# Patient Record
Sex: Female | Born: 1985 | Race: White | Hispanic: Yes | Marital: Married | Smoking: Never smoker
Health system: Southern US, Community
[De-identification: ages and names within clinical notes are randomized; demographics above are authoritative.]

## PROBLEM LIST (undated history)

## (undated) DIAGNOSIS — D649 Anemia, unspecified: Secondary | ICD-10-CM

## (undated) DIAGNOSIS — K219 Gastro-esophageal reflux disease without esophagitis: Secondary | ICD-10-CM

---

## 2006-08-31 ENCOUNTER — Inpatient Hospital Stay (HOSPITAL_COMMUNITY): Admission: AD | Admit: 2006-08-31 | Discharge: 2006-09-01 | Payer: Self-pay | Admitting: Family Medicine

## 2006-09-03 ENCOUNTER — Inpatient Hospital Stay (HOSPITAL_COMMUNITY): Admission: AD | Admit: 2006-09-03 | Discharge: 2006-09-03 | Payer: Self-pay | Admitting: Family Medicine

## 2007-01-11 IMAGING — US US TRANSVAGINAL NON-OB
1 series · 14 of 25 positions shown · non-contrast
Comparison: none

CLINICAL DATA: Pain on left side of stomach.
 TRANSABDOMINAL AND TRANSVAGINAL PELVIC ULTRASOUND:
TECHNIQUE: Both transabdominal and transvaginal ultrasound examinations of the pelvis were performed including evaluation of the uterus, ovaries, adnexal regions, and pelvic cul-de-sac.

[Series 1: us transvaginal non-ob · 0.22mm/px · 14 of 45 slices shown]
[im 1/45]
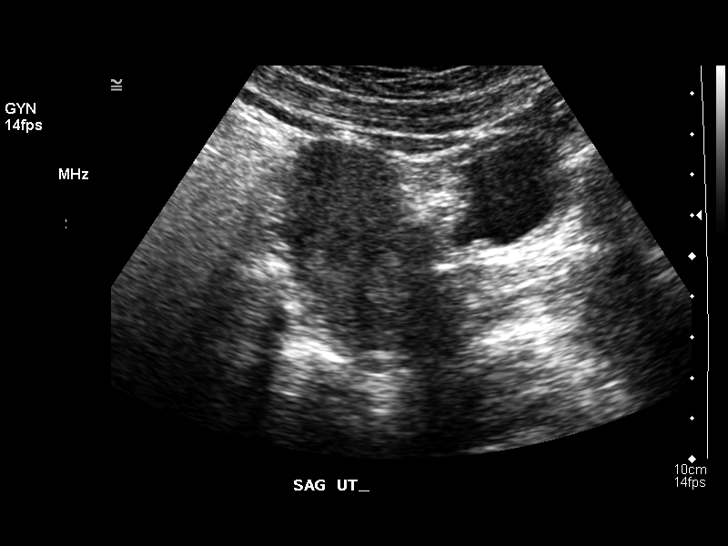
[im 4/45]
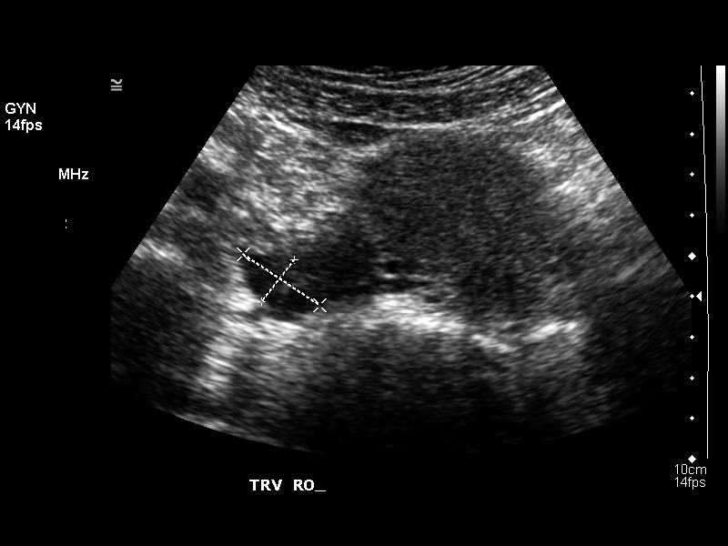
[im 8/45]
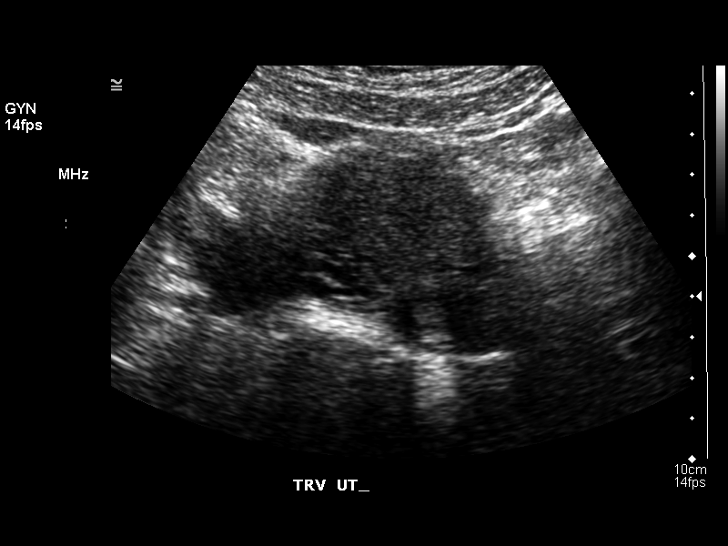
[im 12/45]
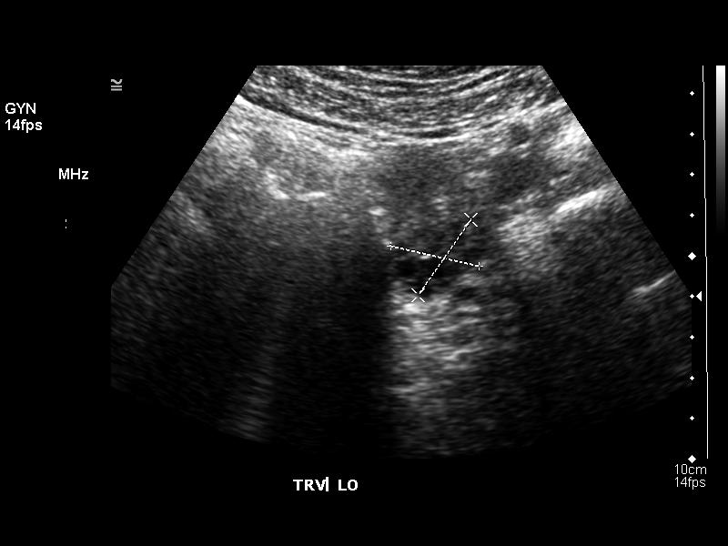
[im 15/45]
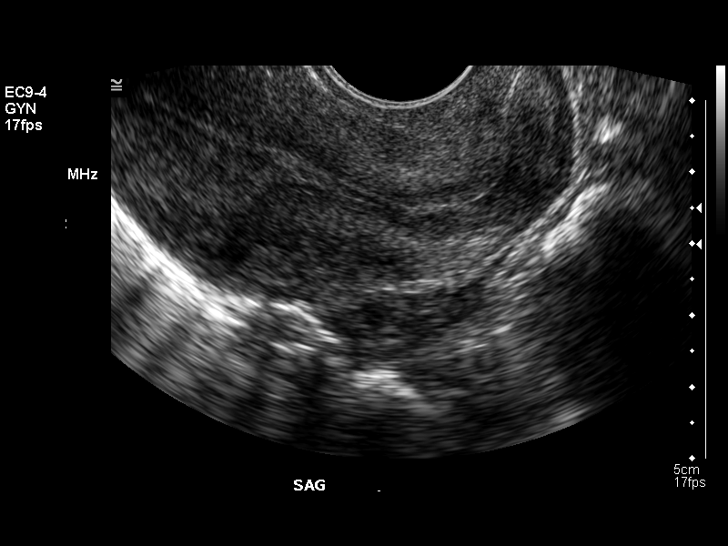
[im 17/45]
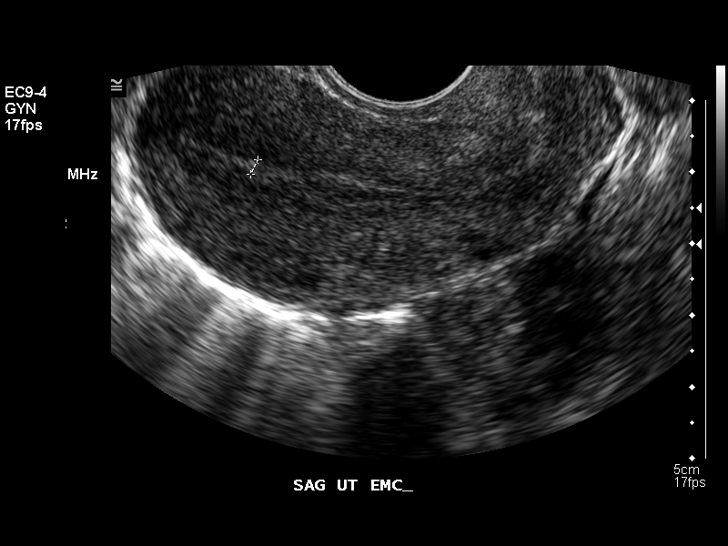
[im 21/45]
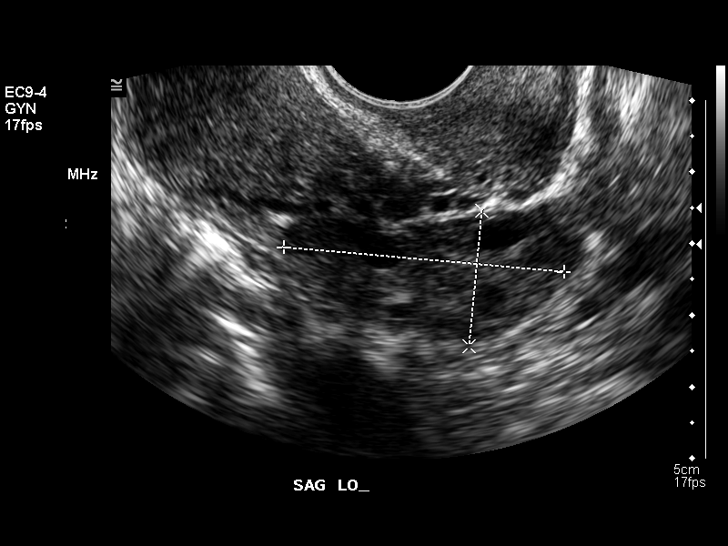
[im 24/45]
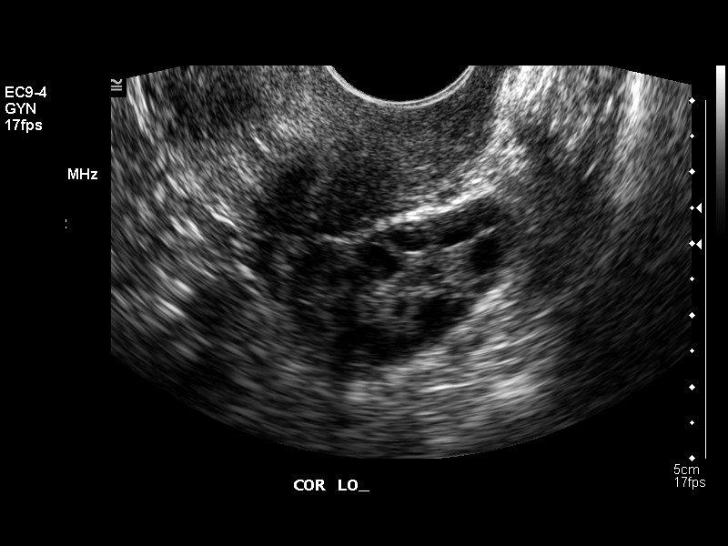
[im 28/45]
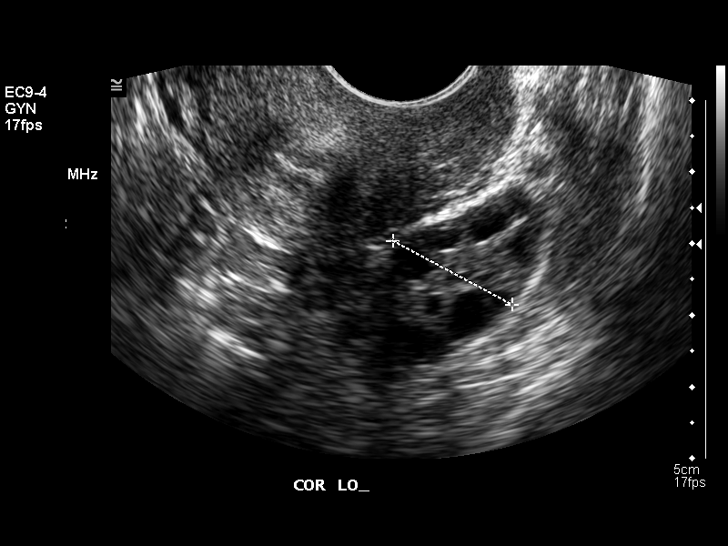
[im 30/45]
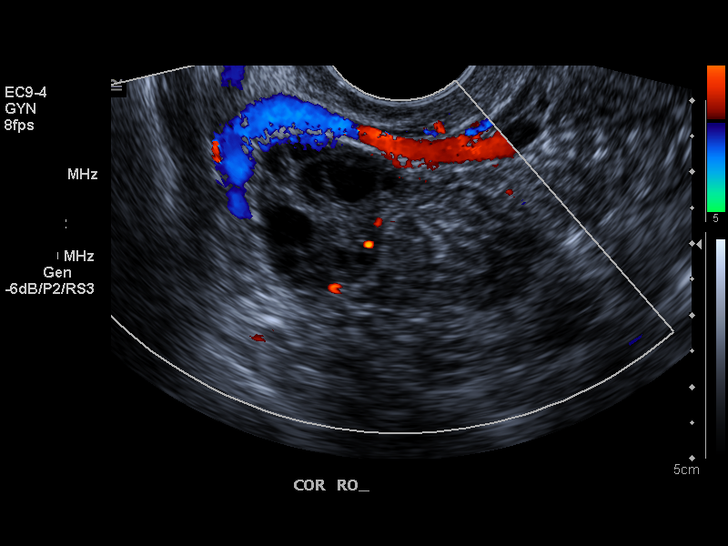
[im 34/45]
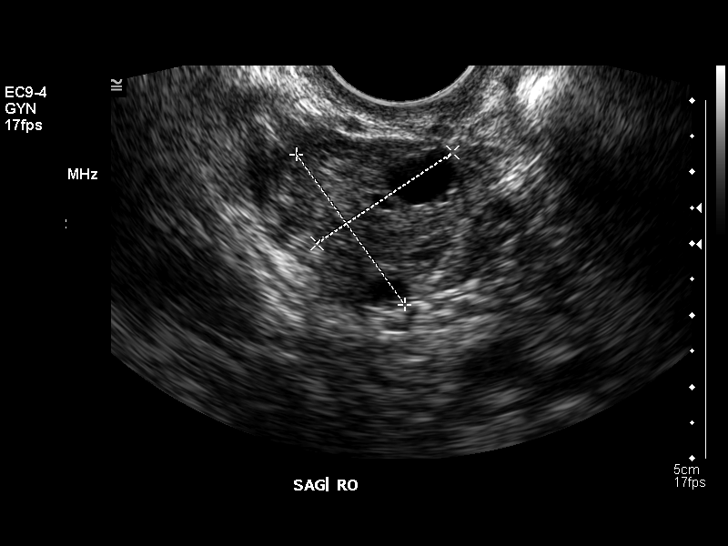
[im 37/45]
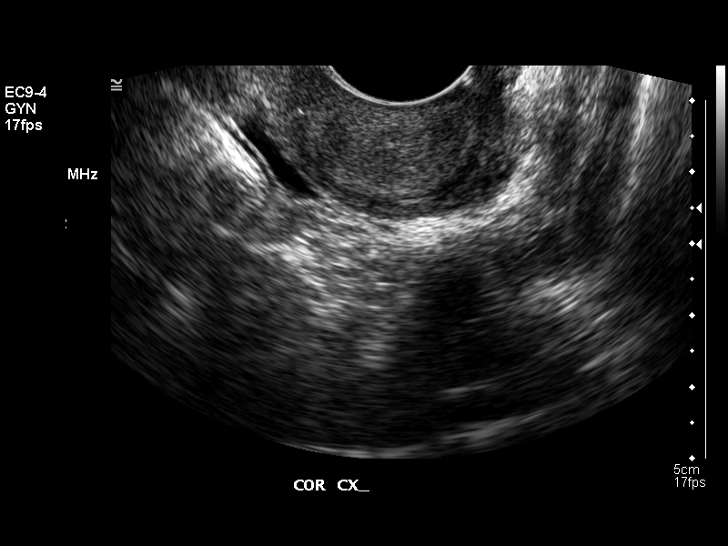
[im 41/45]
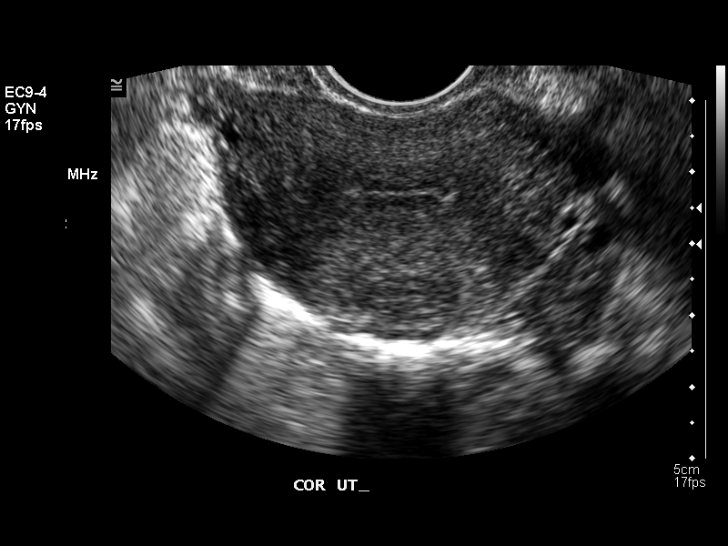
[im 45/45]
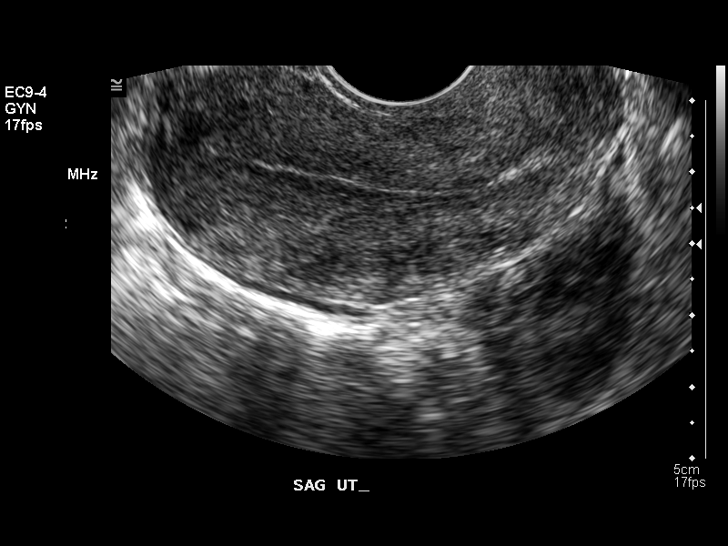

[14 of 25 positions shown; findings below may reference images not displayed]

FINDINGS: The uterus is normal in appearance.  Uterus measures 7.2 x 3.3 x 4.5 cm.  The endometrium measures 2.3 mm in thickness.  The right ovary is normal.  Left ovary is normal.  There is no free fluid.
IMPRESSION: Normal pelvic sonogram.

## 2007-12-06 ENCOUNTER — Ambulatory Visit (HOSPITAL_COMMUNITY): Admission: RE | Admit: 2007-12-06 | Discharge: 2007-12-06 | Payer: Self-pay | Admitting: Obstetrics

## 2008-01-03 ENCOUNTER — Inpatient Hospital Stay (HOSPITAL_COMMUNITY): Admission: AD | Admit: 2008-01-03 | Discharge: 2008-01-06 | Payer: Self-pay | Admitting: Obstetrics

## 2011-04-12 NOTE — Op Note (Signed)
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   POSTOPERATIVE DIAGNOSIS:  Cephalopelvic disproportion, failure to  progress in labor.   ANESTHESIA:  Spinal.   SURGEON:  Kathreen Cosier, M.D.   PROCEDURE:  The patient was placed on the operating table in a supine  position. After spinal was administered, the abdomen was prepped and  draped, and bladder emptied with a Foley catheter.  A transverse  suprapubic incision was made and carried down to the rectus fascia.  The  fascia cleaned and incised the length of incision.  The recti muscles  were retracted laterally.  The peritoneum is incised longitudinally.  A  transverse incision was made in the visceral peritoneum above the  bladder and the bladder mobilized inferiorly.  A transverse lower  uterine incision is made.  The patient delivered from the OP position of  a female Apgar 9/9, weighing 8 pounds 2 ounces. The fluid was clear and  the team was in attendance.  The placenta was posterior, removed  manually, and sent to labor and delivery.  The uterine cavity was  cleaned with dry laps.  The uterine incision was closed in one layer  with continuous suture of #1 chromic.  Hemostasis was satisfactory.  The  bladder flap was reattached with 2-0 chromic.  The uterus well  contracted.  Tubes and ovaries normal.  Abdomen closed in layers,  peritoneum continuous suture of 0 chromic, fascia continuous suture of 0  Dexon, and the skin closed with subcuticular stitch of 4-0 Monocryl.           ______________________________  Kathreen Cosier,  M.D.     BAM/MEDQ  D:  01/04/2008  T:  01/04/2008  Job:  [id number]

## 2011-04-12 NOTE — H&P (Signed)
[name], Lane     ACCOUNT NO.:  [id number]   MEDICAL RECORD NO.:  [id number]          PATIENT TYPE:  INP   LOCATION:  9132                          FACILITY:  [facility]   PHYSICIAN:  Kathreen Cosier, M.D.DATE OF BIRTH:  [birth year]   DATE OF ADMISSION:  01/03/2008  DATE OF DISCHARGE:                              HISTORY & PHYSICAL   PREOPERATIVE HISTORY AND PHYSICAL   The patient is a 25 year old gravida 2, para 0-0-1-0, [facility] December 29, 2007.  She was admitted post dates for induction.  Her cervix was 1 cm,  70% in the vertex at a -3 station, negative GBS.  Membranes were  ruptured artificially at 7:30.  The fluid was clear, and she was started  on Pitocin at 9:00 a.m.  She progressed slowly throughout the course of  the day, and by 8:00 p.m. on February 5, she was fully dilated.  Her  temperature was also 101, and she was given Ancef 1 gram IV and two  Tylenol.  She rapidly defervesced.  The patient pushed for 3 hours, with  a vertex at a zero station, the caput at +2 station, and was felt to be  OP.  No further progress was made after 3 hours of pushing, and it was  decided she would be delivered by a C-section for CPD.   PHYSICAL EXAM:  GENERAL:  Reveals a well-developed female in labor.  HEENT:  Negative.  LUNGS:  Clear.  HEART:  Regular rhythm.  No murmurs or gallops.  BREASTS:  No masses.  ABDOMEN:  Term size uterus.  Estimated fetal weight was 7 pounds.  EXTREMITIES:  Negative.           ______________________________  Kathreen Cosier, M.D.     BAM/MEDQ  D:  01/04/2008  T:  01/04/2008  Job:  [id number]

## 2011-04-15 NOTE — Discharge Summary (Signed)
Cindy Lane, Cindy Lane     ACCOUNT NO.:  [id number]   MEDICAL RECORD NO.:  [id number]          PATIENT TYPE:  INP   LOCATION:  9132                          [facility]ACILITY:  [facility]   PHYSICIAN:  Kathreen Cosier, M.D.DATE OF BIRTH:  [birth year]   DATE OF ADMISSION:  01/03/2008  DATE OF DISCHARGE:  01/06/2008                               DISCHARGE SUMMARY   The patient is a 25 year old gravida 2, para 0-0-1-0, [facility] December 29, 2007.  She was in for induction of 41 weeks.  Cervix 1 cm, 70%, vertex -  3.  The patient became fully dilated at 8:00 p.m. on February 5, pushed  for 3 hours to a 0 station, caput to +2 station, was known to be OP and  was delivered by C-section.  She had an 8-pound 2-ounce female, Apgar 9/9  from the OP position.  Postoperatively, she did well.  On admission, her  hemoglobin was 12; postoperative 9.3.  RPR negative, HIV negative.  She  was discharged on the third postoperative day ambulatory on a regular  diet to see me in 6 weeks.   DISCHARGE DIAGNOSIS:  Status post primary low transverse cesarean  section at term for cephalopelvic disproportion.           ______________________________  Kathreen Cosier, M.D.     BAM/MEDQ  D:  01/16/2008  T:  01/16/2008  Job:  [id number]

## 2011-08-19 LAB — RPR: RPR Ser Ql: NONREACTIVE

## 2011-08-19 LAB — CBC
HCT: 27.1 — ABNORMAL LOW
Hemoglobin: 12
Hemoglobin: 9.3 — ABNORMAL LOW
MCHC: 34.5
MCHC: 35
MCV: 83.8
Platelets: 225
RDW: 14
RDW: 14.4

## 2011-08-19 LAB — RAPID HIV SCREEN (WH-MAU): Rapid HIV Screen: NONREACTIVE

## 2012-05-07 ENCOUNTER — Other Ambulatory Visit (HOSPITAL_COMMUNITY): Payer: Self-pay | Admitting: *Deleted

## 2012-05-07 DIAGNOSIS — Z331 Pregnant state, incidental: Secondary | ICD-10-CM

## 2012-05-07 DIAGNOSIS — Z3689 Encounter for other specified antenatal screening: Secondary | ICD-10-CM

## 2012-05-07 LAB — OB RESULTS CONSOLE ABO/RH

## 2012-05-07 LAB — OB RESULTS CONSOLE RUBELLA ANTIBODY, IGM: Rubella: IMMUNE

## 2012-05-07 LAB — OB RESULTS CONSOLE ANTIBODY SCREEN: Antibody Screen: NEGATIVE

## 2012-05-10 ENCOUNTER — Other Ambulatory Visit (HOSPITAL_COMMUNITY): Payer: Self-pay | Admitting: Physician Assistant

## 2012-05-10 ENCOUNTER — Ambulatory Visit (HOSPITAL_COMMUNITY)
Admission: RE | Admit: 2012-05-10 | Discharge: 2012-05-10 | Disposition: A | Discharge status: Home / Self Care | Payer: Medicaid Other | Source: Ambulatory Visit | Attending: Physician Assistant | Admitting: Physician Assistant

## 2012-05-10 ENCOUNTER — Encounter (HOSPITAL_COMMUNITY): Payer: Self-pay

## 2012-05-10 DIAGNOSIS — Z3689 Encounter for other specified antenatal screening: Secondary | ICD-10-CM

## 2012-05-10 DIAGNOSIS — Z331 Pregnant state, incidental: Secondary | ICD-10-CM

## 2012-05-10 DIAGNOSIS — O358XX0 Maternal care for other (suspected) fetal abnormality and damage, not applicable or unspecified: Secondary | ICD-10-CM | POA: Insufficient documentation

## 2012-05-10 DIAGNOSIS — Z1389 Encounter for screening for other disorder: Secondary | ICD-10-CM | POA: Insufficient documentation

## 2012-05-10 DIAGNOSIS — Z363 Encounter for antenatal screening for malformations: Secondary | ICD-10-CM | POA: Insufficient documentation

## 2012-08-24 ENCOUNTER — Other Ambulatory Visit (HOSPITAL_COMMUNITY): Payer: Self-pay | Admitting: Nurse Practitioner

## 2012-08-24 DIAGNOSIS — O321XX0 Maternal care for breech presentation, not applicable or unspecified: Secondary | ICD-10-CM

## 2012-08-27 ENCOUNTER — Ambulatory Visit (HOSPITAL_COMMUNITY): Admission: RE | Admit: 2012-08-27 | Payer: Medicaid Other | Source: Ambulatory Visit

## 2012-09-03 ENCOUNTER — Encounter (HOSPITAL_COMMUNITY)
Admission: RE | Admit: 2012-09-03 | Discharge: 2012-09-03 | Disposition: A | Discharge status: Home / Self Care | Payer: Medicaid Other | Source: Ambulatory Visit | Attending: Family Medicine | Admitting: Family Medicine

## 2012-09-03 ENCOUNTER — Encounter (HOSPITAL_COMMUNITY): Payer: Self-pay

## 2012-09-03 ENCOUNTER — Encounter (HOSPITAL_COMMUNITY): Payer: Self-pay | Admitting: Pharmacy Technician

## 2012-09-03 HISTORY — DX: Anemia, unspecified: D64.9

## 2012-09-03 HISTORY — DX: Gastro-esophageal reflux disease without esophagitis: K21.9

## 2012-09-03 LAB — CBC
MCV: 83.7 fL (ref 78.0–100.0)
Platelets: 346 10*3/uL (ref 150–400)
RBC: 4.59 MIL/uL (ref 3.87–5.11)
RDW: 15.7 % — ABNORMAL HIGH (ref 11.5–15.5)
WBC: 11.1 10*3/uL — ABNORMAL HIGH (ref 4.0–10.5)

## 2012-09-03 LAB — SURGICAL PCR SCREEN
MRSA, PCR: NEGATIVE
Staphylococcus aureus: NEGATIVE

## 2012-09-03 LAB — TYPE AND SCREEN
ABO/RH(D): A POS
Antibody Screen: NEGATIVE

## 2012-09-03 LAB — RPR: RPR Ser Ql: NONREACTIVE

## 2012-09-03 NOTE — Patient Instructions (Addendum)
   Your procedure is scheduled AO:[id number] October 11th  Enter through the Main Entrance of [facility] at: 7:45am Pick up the phone at the desk and dial [phone] and inform us of your arrival.  Please call this number if you have any problems the morning of surgery: [phone]  Remember: Do not eat or drink anything after midnight on Thursday   Do not wear jewelry, make-up, or FINGER nail polish No metal in your hair or on your body. Do not wear lotions, powders, perfumes. You may wear deodorant.  Please use your CHG wash as directed prior to surgery.  Do not shave anywhere for at least 12 hours prior to first CHG shower.  Do not bring valuables to the hospital. Contacts, dentures or bridgework may not be worn into surgery.  Leave suitcase in the car. After Surgery it may be brought to your room. For patients being admitted to the hospital, checkout time is 11:00am the day of discharge.

## 2012-09-07 ENCOUNTER — Encounter (HOSPITAL_COMMUNITY): Payer: Self-pay | Admitting: Anesthesiology

## 2012-09-07 ENCOUNTER — Inpatient Hospital Stay (HOSPITAL_COMMUNITY): Payer: Medicaid Other | Admitting: Anesthesiology

## 2012-09-07 ENCOUNTER — Encounter (HOSPITAL_COMMUNITY)
Admission: RE | Disposition: A | Discharge status: Home / Self Care | Payer: Self-pay | Source: Ambulatory Visit | Attending: Family Medicine

## 2012-09-07 ENCOUNTER — Inpatient Hospital Stay (HOSPITAL_COMMUNITY)
Admission: RE | Admit: 2012-09-07 | Discharge: 2012-09-10 | DRG: 766 | Disposition: A | Discharge status: Home / Self Care | Payer: Medicaid Other | Source: Ambulatory Visit | Attending: Family Medicine | Admitting: Family Medicine

## 2012-09-07 ENCOUNTER — Encounter (HOSPITAL_COMMUNITY): Payer: Self-pay | Admitting: *Deleted

## 2012-09-07 DIAGNOSIS — O9279 Other disorders of lactation: Secondary | ICD-10-CM | POA: Diagnosis not present

## 2012-09-07 DIAGNOSIS — O34219 Maternal care for unspecified type scar from previous cesarean delivery: Principal | ICD-10-CM | POA: Diagnosis present

## 2012-09-07 SURGERY — Surgical Case
Anesthesia: Spinal | Wound class: Clean Contaminated

## 2012-09-07 MED ORDER — DIPHENHYDRAMINE HCL 50 MG/ML IJ SOLN: 12.5000 mg | INTRAMUSCULAR | Status: DC | PRN

## 2012-09-07 MED ORDER — ONDANSETRON HCL 4 MG/2ML IJ SOLN: 4.0000 mg | Freq: Three times a day (TID) | INTRAMUSCULAR | Status: DC | PRN

## 2012-09-07 MED ORDER — DEXTROSE IN LACTATED RINGERS 5 % IV SOLN
INTRAVENOUS | Status: DC
  Administered 2012-09-07 – 2012-09-08 (×2): via INTRAVENOUS

## 2012-09-07 MED ORDER — KETOROLAC TROMETHAMINE 30 MG/ML IJ SOLN: 30.0000 mg | Freq: Four times a day (QID) | INTRAMUSCULAR | Status: AC | PRN

## 2012-09-07 MED ORDER — MORPHINE SULFATE 0.5 MG/ML IJ SOLN
INTRAMUSCULAR | Status: AC
  Filled 2012-09-07: qty 10

## 2012-09-07 MED ORDER — ONDANSETRON HCL 4 MG/2ML IJ SOLN: 4.0000 mg | INTRAMUSCULAR | Status: DC | PRN

## 2012-09-07 MED ORDER — LACTATED RINGERS IV SOLN
40.0000 [IU] | INTRAVENOUS | Status: DC | PRN
  Administered 2012-09-07: 40 [IU] via INTRAVENOUS

## 2012-09-07 MED ORDER — KETOROLAC TROMETHAMINE 60 MG/2ML IM SOLN
60.0000 mg | Freq: Once | INTRAMUSCULAR | Status: AC | PRN
  Filled 2012-09-07: qty 2

## 2012-09-07 MED ORDER — WITCH HAZEL-GLYCERIN EX PADS: 1.0000 "application " | MEDICATED_PAD | CUTANEOUS | Status: DC | PRN

## 2012-09-07 MED ORDER — SCOPOLAMINE 1 MG/3DAYS TD PT72
1.0000 | MEDICATED_PATCH | Freq: Once | TRANSDERMAL | Status: DC
  Administered 2012-09-07: 1.5 mg via TRANSDERMAL

## 2012-09-07 MED ORDER — LACTATED RINGERS IV SOLN
INTRAVENOUS | Status: DC | PRN
  Administered 2012-09-07: 10:00:00 via INTRAVENOUS

## 2012-09-07 MED ORDER — MORPHINE SULFATE (PF) 0.5 MG/ML IJ SOLN
INTRAMUSCULAR | Status: DC | PRN
  Administered 2012-09-07: .1 mg via INTRATHECAL

## 2012-09-07 MED ORDER — MENTHOL 3 MG MT LOZG: 1.0000 | LOZENGE | OROMUCOSAL | Status: DC | PRN

## 2012-09-07 MED ORDER — DIPHENHYDRAMINE HCL 25 MG PO CAPS: 25.0000 mg | ORAL_CAPSULE | ORAL | Status: DC | PRN

## 2012-09-07 MED ORDER — TETANUS-DIPHTH-ACELL PERTUSSIS 5-2.5-18.5 LF-MCG/0.5 IM SUSP
0.5000 mL | Freq: Once | INTRAMUSCULAR | Status: DC
  Filled 2012-09-07 (×2): qty 0.5

## 2012-09-07 MED ORDER — LACTATED RINGERS IV SOLN
INTRAVENOUS | Status: DC
  Administered 2012-09-07: 10:00:00 via INTRAVENOUS
  Administered 2012-09-07: 125 mL/h via INTRAVENOUS

## 2012-09-07 MED ORDER — IBUPROFEN 600 MG PO TABS
600.0000 mg | ORAL_TABLET | Freq: Four times a day (QID) | ORAL | Status: DC
  Administered 2012-09-07 – 2012-09-10 (×12): 600 mg via ORAL
  Filled 2012-09-07 (×12): qty 1

## 2012-09-07 MED ORDER — HYDROMORPHONE HCL PF 1 MG/ML IJ SOLN: 0.2500 mg | INTRAMUSCULAR | Status: DC | PRN

## 2012-09-07 MED ORDER — BUPIVACAINE HCL (PF) 0.25 % IJ SOLN
INTRAMUSCULAR | Status: DC | PRN
  Administered 2012-09-07: 30 mL

## 2012-09-07 MED ORDER — NALOXONE HCL 0.4 MG/ML IJ SOLN: 0.4000 mg | INTRAMUSCULAR | Status: DC | PRN

## 2012-09-07 MED ORDER — SCOPOLAMINE 1 MG/3DAYS TD PT72
MEDICATED_PATCH | TRANSDERMAL | Status: AC
  Administered 2012-09-07: 1.5 mg via TRANSDERMAL
  Filled 2012-09-07: qty 1

## 2012-09-07 MED ORDER — CEFAZOLIN SODIUM-DEXTROSE 2-3 GM-% IV SOLR
2.0000 g | Freq: Once | INTRAVENOUS | Status: AC
  Administered 2012-09-07: 2 g via INTRAVENOUS

## 2012-09-07 MED ORDER — PANTOPRAZOLE SODIUM 40 MG PO TBEC
DELAYED_RELEASE_TABLET | ORAL | Status: AC
  Administered 2012-09-07: 40 mg via ORAL
  Filled 2012-09-07: qty 1

## 2012-09-07 MED ORDER — OXYTOCIN 40 UNITS IN LACTATED RINGERS INFUSION - SIMPLE MED: 62.5000 mL/h | INTRAVENOUS | Status: AC

## 2012-09-07 MED ORDER — SIMETHICONE 80 MG PO CHEW: 80.0000 mg | CHEWABLE_TABLET | ORAL | Status: DC | PRN

## 2012-09-07 MED ORDER — PRENATAL MULTIVITAMIN CH
1.0000 | ORAL_TABLET | Freq: Every day | ORAL | Status: DC
  Administered 2012-09-07 – 2012-09-10 (×4): 1 via ORAL
  Filled 2012-09-07 (×4): qty 1

## 2012-09-07 MED ORDER — SODIUM CHLORIDE 0.9 % IJ SOLN: 3.0000 mL | INTRAMUSCULAR | Status: DC | PRN

## 2012-09-07 MED ORDER — SIMETHICONE 80 MG PO CHEW
80.0000 mg | CHEWABLE_TABLET | Freq: Three times a day (TID) | ORAL | Status: DC
  Administered 2012-09-07 – 2012-09-10 (×11): 80 mg via ORAL

## 2012-09-07 MED ORDER — FENTANYL CITRATE 0.05 MG/ML IJ SOLN
INTRAMUSCULAR | Status: AC
  Filled 2012-09-07: qty 2

## 2012-09-07 MED ORDER — LANOLIN HYDROUS EX OINT: 1.0000 "application " | TOPICAL_OINTMENT | CUTANEOUS | Status: DC | PRN

## 2012-09-07 MED ORDER — FENTANYL CITRATE 0.05 MG/ML IJ SOLN
INTRAMUSCULAR | Status: DC | PRN
  Administered 2012-09-07: 15 ug via INTRATHECAL

## 2012-09-07 MED ORDER — PROMETHAZINE HCL 25 MG/ML IJ SOLN: 6.2500 mg | INTRAMUSCULAR | Status: DC | PRN

## 2012-09-07 MED ORDER — ONDANSETRON HCL 4 MG PO TABS: 4.0000 mg | ORAL_TABLET | ORAL | Status: DC | PRN

## 2012-09-07 MED ORDER — MEASLES, MUMPS & RUBELLA VAC ~~LOC~~ INJ
0.5000 mL | INJECTION | Freq: Once | SUBCUTANEOUS | Status: DC
  Filled 2012-09-07: qty 0.5

## 2012-09-07 MED ORDER — MEPERIDINE HCL 25 MG/ML IJ SOLN: 6.2500 mg | INTRAMUSCULAR | Status: DC | PRN

## 2012-09-07 MED ORDER — 0.9 % SODIUM CHLORIDE (POUR BTL) OPTIME
TOPICAL | Status: DC | PRN
  Administered 2012-09-07: 1000 mL

## 2012-09-07 MED ORDER — KETOROLAC TROMETHAMINE 30 MG/ML IJ SOLN
INTRAMUSCULAR | Status: AC
  Administered 2012-09-07: 30 mg via INTRAVENOUS
  Filled 2012-09-07: qty 1

## 2012-09-07 MED ORDER — METOCLOPRAMIDE HCL 5 MG/ML IJ SOLN: 10.0000 mg | Freq: Three times a day (TID) | INTRAMUSCULAR | Status: DC | PRN

## 2012-09-07 MED ORDER — ONDANSETRON HCL 4 MG/2ML IJ SOLN
INTRAMUSCULAR | Status: AC
  Filled 2012-09-07: qty 2

## 2012-09-07 MED ORDER — CEFAZOLIN SODIUM-DEXTROSE 2-3 GM-% IV SOLR
INTRAVENOUS | Status: AC
  Filled 2012-09-07: qty 50

## 2012-09-07 MED ORDER — BUPIVACAINE HCL (PF) 0.25 % IJ SOLN
INTRAMUSCULAR | Status: AC
  Filled 2012-09-07: qty 30

## 2012-09-07 MED ORDER — SODIUM CHLORIDE 0.9 % IV SOLN
1.0000 ug/kg/h | INTRAVENOUS | Status: DC | PRN
  Filled 2012-09-07: qty 2.5

## 2012-09-07 MED ORDER — BUPIVACAINE IN DEXTROSE 0.75-8.25 % IT SOLN
INTRATHECAL | Status: DC | PRN
  Administered 2012-09-07: 9 mg via INTRATHECAL

## 2012-09-07 MED ORDER — PANTOPRAZOLE SODIUM 40 MG PO TBEC
40.0000 mg | DELAYED_RELEASE_TABLET | Freq: Once | ORAL | Status: AC
  Administered 2012-09-07: 40 mg via ORAL

## 2012-09-07 MED ORDER — DIPHENHYDRAMINE HCL 50 MG/ML IJ SOLN: 25.0000 mg | INTRAMUSCULAR | Status: DC | PRN

## 2012-09-07 MED ORDER — NALBUPHINE HCL 10 MG/ML IJ SOLN
5.0000 mg | INTRAMUSCULAR | Status: DC | PRN
  Filled 2012-09-07: qty 1

## 2012-09-07 MED ORDER — KETOROLAC TROMETHAMINE 30 MG/ML IJ SOLN
15.0000 mg | Freq: Once | INTRAMUSCULAR | Status: AC | PRN
  Administered 2012-09-07: 30 mg via INTRAVENOUS

## 2012-09-07 MED ORDER — EPHEDRINE 5 MG/ML INJ
INTRAVENOUS | Status: AC
  Filled 2012-09-07: qty 10

## 2012-09-07 MED ORDER — DIBUCAINE 1 % RE OINT: 1.0000 "application " | TOPICAL_OINTMENT | RECTAL | Status: DC | PRN

## 2012-09-07 MED ORDER — ONDANSETRON HCL 4 MG/2ML IJ SOLN
INTRAMUSCULAR | Status: DC | PRN
  Administered 2012-09-07: 4 mg via INTRAVENOUS

## 2012-09-07 MED ORDER — OXYCODONE-ACETAMINOPHEN 5-325 MG PO TABS
1.0000 | ORAL_TABLET | ORAL | Status: DC | PRN
  Administered 2012-09-08 – 2012-09-09 (×4): 1 via ORAL
  Filled 2012-09-07 (×4): qty 1

## 2012-09-07 MED ORDER — SENNOSIDES-DOCUSATE SODIUM 8.6-50 MG PO TABS
2.0000 | ORAL_TABLET | Freq: Every day | ORAL | Status: DC
  Administered 2012-09-07 – 2012-09-08 (×2): 2 via ORAL

## 2012-09-07 MED ORDER — DIPHENHYDRAMINE HCL 25 MG PO CAPS: 25.0000 mg | ORAL_CAPSULE | Freq: Four times a day (QID) | ORAL | Status: DC | PRN

## 2012-09-07 MED ORDER — EPHEDRINE SULFATE 50 MG/ML IJ SOLN
INTRAMUSCULAR | Status: DC | PRN
  Administered 2012-09-07 (×4): 10 mg via INTRAVENOUS

## 2012-09-07 MED ORDER — ZOLPIDEM TARTRATE 5 MG PO TABS: 5.0000 mg | ORAL_TABLET | Freq: Every evening | ORAL | Status: DC | PRN

## 2012-09-07 SURGICAL SUPPLY — 37 items
CLOTH BEACON ORANGE TIMEOUT ST (SAFETY) ×2 IMPLANT
DRAPE SURG 17X23 STRL (DRAPES) ×2 IMPLANT
DRESSING TELFA 8X3 (GAUZE/BANDAGES/DRESSINGS) ×2 IMPLANT
DRSG COVADERM 4X10 (GAUZE/BANDAGES/DRESSINGS) IMPLANT
DURAPREP 26ML APPLICATOR (WOUND CARE) ×2 IMPLANT
ELECT REM PT RETURN 9FT ADLT (ELECTROSURGICAL) ×2
ELECTRODE REM PT RTRN 9FT ADLT (ELECTROSURGICAL) ×1 IMPLANT
EXTRACTOR VACUUM M CUP 4 TUBE (SUCTIONS) IMPLANT
GAUZE SPONGE 4X4 12PLY STRL LF (GAUZE/BANDAGES/DRESSINGS) ×4 IMPLANT
GLOVE BIOGEL PI IND STRL 7.0 (GLOVE) ×1 IMPLANT
GLOVE BIOGEL PI IND STRL 7.5 (GLOVE) IMPLANT
GLOVE BIOGEL PI INDICATOR 7.0 (GLOVE) ×1
GLOVE BIOGEL PI INDICATOR 7.5 (GLOVE) ×2
GLOVE ECLIPSE 7.0 STRL STRAW (GLOVE) ×4 IMPLANT
GLOVE SURG SS PI 7.0 STRL IVOR (GLOVE) ×2 IMPLANT
GOWN PREVENTION PLUS LG XLONG (DISPOSABLE) ×4 IMPLANT
GOWN PREVENTION PLUS XLARGE (GOWN DISPOSABLE) ×2 IMPLANT
KIT ABG SYR 3ML LUER SLIP (SYRINGE) IMPLANT
NDL HYPO 25X5/8 SAFETYGLIDE (NEEDLE) IMPLANT
NEEDLE HYPO 22GX1.5 SAFETY (NEEDLE) ×2 IMPLANT
NEEDLE HYPO 25X5/8 SAFETYGLIDE (NEEDLE) IMPLANT
NS IRRIG 1000ML POUR BTL (IV SOLUTION) ×2 IMPLANT
PACK C SECTION WH (CUSTOM PROCEDURE TRAY) ×2 IMPLANT
PAD ABD 7.5X8 STRL (GAUZE/BANDAGES/DRESSINGS) ×1 IMPLANT
PAD OB MATERNITY 4.3X12.25 (PERSONAL CARE ITEMS) ×1 IMPLANT
RTRCTR C-SECT PINK 25CM LRG (MISCELLANEOUS) ×1 IMPLANT
SLEEVE SCD COMPRESS KNEE MED (MISCELLANEOUS) IMPLANT
SPONGE GAUZE 4X4 12PLY (GAUZE/BANDAGES/DRESSINGS) ×1 IMPLANT
STAPLER VISISTAT 35W (STAPLE) IMPLANT
SUT VIC AB 0 CTX 36 (SUTURE) ×6
SUT VIC AB 0 CTX36XBRD ANBCTRL (SUTURE) ×3 IMPLANT
SUT VIC AB 4-0 KS 27 (SUTURE) IMPLANT
SYR 30ML LL (SYRINGE) ×2 IMPLANT
TAPE CLOTH SURG 4X10 WHT LF (GAUZE/BANDAGES/DRESSINGS) ×1 IMPLANT
TOWEL OR 17X24 6PK STRL BLUE (TOWEL DISPOSABLE) ×4 IMPLANT
TRAY FOLEY CATH 14FR (SET/KITS/TRAYS/PACK) ×2 IMPLANT
WATER STERILE IRR 1000ML POUR (IV SOLUTION) ×2 IMPLANT

## 2012-09-07 NOTE — Anesthesia Preprocedure Evaluation (Signed)
Anesthesia Evaluation  Patient identified by MRN, date of birth, ID band Patient awake    Reviewed: Allergy & Precautions, H&P , NPO status , Patient's Chart, lab work & pertinent test results  Airway Mallampati: I TM Distance: >3 FB Neck ROM: full    Dental No notable dental hx.    Pulmonary neg pulmonary ROS,    Pulmonary exam normal       Cardiovascular negative cardio ROS      Neuro/Psych negative neurological ROS  negative psych ROS   GI/Hepatic Neg liver ROS, GERD-  ,  Endo/Other  negative endocrine ROS  Renal/GU negative Renal ROS  negative genitourinary   Musculoskeletal negative musculoskeletal ROS (+)   Abdominal Normal abdominal exam  (+)   Peds negative pediatric ROS (+)  Hematology negative hematology ROS (+)   Anesthesia Other Findings   Reproductive/Obstetrics (+) Pregnancy                           Anesthesia Physical Anesthesia Plan  ASA: II  Anesthesia Plan: Spinal   Post-op Pain Management:    Induction:   Airway Management Planned:   Additional Equipment:   Intra-op Plan:   Post-operative Plan:   Informed Consent: I have reviewed the patients History and Physical, chart, labs and discussed the procedure including the risks, benefits and alternatives for the proposed anesthesia with the patient or authorized representative who has indicated his/her understanding and acceptance.     Plan Discussed with: CRNA and Surgeon  Anesthesia Plan Comments:         Anesthesia Quick Evaluation  

## 2012-09-07 NOTE — H&P (Signed)
  Cindy Lane is an 26 y.o. G2P1 [redacted]w[redacted]d female.   Chief Complaint: Previous Cesarean Section  HPI: 26 y.o. G2P1 @ [redacted]w[redacted]d with previous C-section.  Pt. Desires elective repeat.   Past Medical History  Diagnosis Date  . Anemia   . GERD (gastroesophageal reflux disease)     pregnancy    Past Surgical History  Procedure Date  . Cesarean section     No family history on file. Social History:  reports that she has never smoked. She does not have any smokeless tobacco history on file. She reports that she does not drink alcohol or use illicit drugs.  Allergies: No Known Allergies  Medications Prior to Admission  Medication Sig Dispense Refill  . Prenatal Vit-Fe Fumarate-FA (PRENATAL MULTIVITAMIN) TABS Take 1 tablet by mouth daily.         A comprehensive review of systems was negative.  Blood pressure 114/65, pulse 78, temperature 98.6 F (37 C), temperature source Oral, resp. rate 16, last menstrual period 11/21/2011, SpO2 99.00%. BP 114/65  Pulse 78  Temp 98.6 F (37 C) (Oral)  Resp 16  SpO2 99%  LMP 11/21/2011 General appearance: alert, cooperative and appears stated age Head: Normocephalic, without obvious abnormality, atraumatic Eyes: negative findings: sclera non-icteric Neck: no adenopathy, supple, symmetrical, trachea midline and thyroid not enlarged, symmetric, no tenderness/mass/nodules Lungs: clear to auscultation bilaterally Heart: regular rate and rhythm, S1, S2 normal, no murmur, click, rub or gallop Abdomen: soft, non-tender; bowel sounds normal; no masses,  no organomegaly Extremities: extremities normal, atraumatic, no cyanosis or edema Pulses: 2+ and symmetric Skin: Skin color, texture, turgor normal. No rashes or lesions Neurologic: Grossly normal   Lab Results  Component Value Date   WBC 11.1* 09/03/2012   HGB 12.5 09/03/2012   HCT 38.4 09/03/2012   MCV 83.7 09/03/2012   PLT 346 09/03/2012   No results found for this basename: PREGTESTUR,  PREGSERUM, HCG, HCGQUANT     Assessment/Plan Previous C-section For Elective repeat. Risks include but are not limited to bleeding, infection, injury to surrounding structures, including bowel, bladder and ureters, blood clots, and death.  Likelihood of success is high.   PRATT,TANYA S 09/07/2012, 8:57 AM

## 2012-09-07 NOTE — Transfer of Care (Signed)
Immediate Anesthesia Transfer of Care Note  Patient: Cindy Lane  Procedure(s) Performed: Procedure(s) (LRB) with comments: CESAREAN SECTION (N/A) - repeat  Patient Location: PACU  Anesthesia Type: Spinal  Level of Consciousness: awake, alert  and oriented  Airway & Oxygen Therapy: Patient Spontanous Breathing  Post-op Assessment: Report given to PACU RN and Post -op Vital signs reviewed and stable  Post vital signs: Reviewed and stable  Complications: No apparent anesthesia complications

## 2012-09-07 NOTE — Anesthesia Postprocedure Evaluation (Signed)
Anesthesia Post Note  Patient: Cindy Lane  Procedure(s) Performed: Procedure(s) (LRB): CESAREAN SECTION (N/A)  Anesthesia type: Spinal  Patient location: PACU  Post pain: Pain level controlled  Post assessment: Post-op Vital signs reviewed  Last Vitals:  Filed Vitals:   09/07/12 1130  BP: 106/85  Pulse: 71  Temp:   Resp: 20    Post vital signs: Reviewed  Level of consciousness: awake  Complications: No apparent anesthesia complications

## 2012-09-07 NOTE — Anesthesia Procedure Notes (Signed)
Spinal  Patient location during procedure: OR Start time: 09/07/2012 9:50 AM Staffing Performed by: anesthesiologist  Preanesthetic Checklist Completed: patient identified, site marked, surgical consent, pre-op evaluation, timeout performed, IV checked, risks and benefits discussed and monitors and equipment checked Spinal Block Patient position: sitting Prep: site prepped and draped and DuraPrep Patient monitoring: heart rate, cardiac monitor, continuous pulse ox and blood pressure Approach: midline Location: L3-4 Injection technique: single-shot Needle Needle type: Sprotte  Needle gauge: 24 G Needle length: 9 cm Assessment Sensory level: T4 Additional Notes Clear free flow CSF on second attempt by SRNA.  No paresthesia.  Patient tolerated procedure well.  Present for entire procedure.  Jasmine December, MD

## 2012-09-07 NOTE — Anesthesia Postprocedure Evaluation (Signed)
Anesthesia Post Note  Patient: Cindy Lane  Procedure(s) Performed: Procedure(s) (LRB): CESAREAN SECTION (N/A)  Anesthesia type: SAB  Patient location: Mother/Baby  Post pain: Pain level controlled  Post assessment: Post-op Vital signs reviewed  Last Vitals:  Filed Vitals:   09/07/12 1515  BP: 106/61  Pulse: 69  Temp: 36.8 C  Resp: 20    Post vital signs: Reviewed  Level of consciousness: awake  Complications: No apparent anesthesia complications

## 2012-09-07 NOTE — Addendum Note (Signed)
Addendum  created 09/07/12 1556 by Jhonnie Garner, CRNA   Modules edited:Notes Section

## 2012-09-07 NOTE — Op Note (Signed)
Preoperative Diagnosis:  IUP @ [redacted]w[redacted]d, Previous C-section, desired elective repeat  Postoperative Diagnosis:  Same  Procedure: Repeat low transverse cesarean section  Surgeon: Tinnie Gens, M.D.  Assistant: None  Findings: Viable female infant, APGAR (1 MIN): 9  APGAR (5 MINS): 9, vertex presentation, LOA position, nml tubes and ovaries  Estimated blood loss: 1000 cc  Complications: None known  Specimens: Placenta to labor and delivery  Reason for procedure: Briefly, the patient is a 26 y.o. G2P1 [redacted]w[redacted]d who presents for elective repeat C-section.  Risks of repeat cesarean section reviewed.  Risks include but are not limited to bleeding, infection, injury to surrounding structures, hysterectomy, future abnormal placentation, blood clots, and death. Cindy Lane, interpreter used.  Procedure: Patient is a to the OR where spinal analgesia was administered. She was then placed in a supine position with left lateral tilt. She received 2 g of Ancef and SCDs were in place. A timeout was performed. She was prepped and draped in the usual sterile fashion. A Foley catheter was placed in the bladder. A knife was then used to make a Pfannenstiel incision. This incision was carried out to underlying fascia which was divided in the midline with the elecrocautery. The incision was extended laterally, sharply.  The superior edge of fascia was dissected off the underlying rectus with the electrocautery.  The rectus was divided in the midline. Peritoneal adhesions were taken down with the cautery.  The left rectus was divided to allow adequate room for delivery. The peritoneal cavity was entered bluntly.  Alexis retractor was placed inside the incision.  A knife was used to make a low transverse incision on the uterus. This incision was carried down to the amniotic cavity which was entered. Fetus was in LOA position and was brought up out of the incision without difficulty. Cord was clamped x 2 and cut. Infant taken  to waiting pediatrician.  Placenta was delivered from the uterus.  Uterus was cleaned with dry lap pads. Uterine incision closed with 0 Vicryl suture in a locked running fashion. Hemostasis was noted.  Ovaries and tubes were inspected and were normal. Alexis retractor was removed from the abdomen. Peritoneal closure was done with 0 Vicryl suture.  Fascia is closed with 0 Vicryl suture in a running fashion. Subcutaneous tissue infused with 30cc 0.25% Marcaine. Skin closed using 3-0 Vicryl on a Keith needle, followed by pressure dressing.  All instrument, needle and lap counts were correct x 2.  Patient was awake and taken to PACU stable.  Infant to Newborn Nursery, stable.   Cindy Lane SMD 09/07/2012 10:43 AM

## 2012-09-08 LAB — CBC
HCT: 31.2 % — ABNORMAL LOW (ref 36.0–46.0)
Hemoglobin: 10.5 g/dL — ABNORMAL LOW (ref 12.0–15.0)
MCHC: 33.7 g/dL (ref 30.0–36.0)
RBC: 3.71 MIL/uL — ABNORMAL LOW (ref 3.87–5.11)
WBC: 12.6 10*3/uL — ABNORMAL HIGH (ref 4.0–10.5)

## 2012-09-08 NOTE — Progress Notes (Signed)
Subjective: Postpartum Day #1: Cesarean Delivery Patient reports tolerating PO and no problems voiding; bottlefeeding Objective: Vital signs in last 24 hours: Temp:  [97.8 F (36.6 C)-98.6 F (37 C)] 98.1 F (36.7 C) (10/12 0556) Pulse Rate:  [60-82] 71  (10/12 0556) Resp:  [14-20] 18  (10/12 0556) BP: (86-148)/(47-85) 101/47 mmHg (10/12 0556) SpO2:  [95 %-100 %] 96 % (10/12 0556) Weight:  [58.968 kg (130 lb)] 58.968 kg (130 lb) (10/11 1351)  Physical Exam:  General: alert, cooperative and mild distress Lochia: appropriate Uterine Fundus: firm Incision: dsg intact, clean & dry DVT Evaluation: No evidence of DVT seen on physical exam.   Basename 09/08/12 0615  HGB 10.5*  HCT 31.2*    Assessment/Plan: Status post Cesarean section. Doing well postoperatively.  Continue current care.  Cam Hai 09/08/2012, 7:27 AM

## 2012-09-09 NOTE — Progress Notes (Signed)
Subjective: Postpartum Day 2: Cesarean Delivery Patient reports tolerating PO, + flatus and no problems voiding.    Objective: Vital signs in last 24 hours: Temp:  [97.7 F (36.5 C)-98.4 F (36.9 C)] 97.9 F (36.6 C) (10/13 0521) Pulse Rate:  [66-82] 75  (10/13 0521) Resp:  [18-20] 18  (10/13 0521) BP: (100-110)/(43-71) 110/71 mmHg (10/13 0521) SpO2:  [98 %] 98 % (10/12 1008)  Physical Exam:  General: alert, cooperative and no distress Lochia: appropriate Uterine Fundus: firm Incision: dressing intact, no drainage DVT Evaluation: No evidence of DVT seen on physical exam. Negative Homan's sign. No cords or calf tenderness. No significant calf/ankle edema.   Basename 09/08/12 0615  HGB 10.5*  HCT 31.2*    Assessment/Plan: Status post Cesarean section. Doing well postoperatively.  Continue current care.  Breastfeeding, lactation consult.  Undecided about contraception.  Marge Duncans 09/09/2012, 7:33 AM

## 2012-09-10 ENCOUNTER — Encounter (HOSPITAL_COMMUNITY): Payer: Self-pay | Admitting: Family Medicine

## 2012-09-10 MED ORDER — IBUPROFEN 600 MG PO TABS: 600.0000 mg | ORAL_TABLET | Freq: Four times a day (QID) | ORAL | Status: DC

## 2012-09-10 MED ORDER — OXYCODONE-ACETAMINOPHEN 5-325 MG PO TABS: 1.0000 | ORAL_TABLET | ORAL | Status: DC | PRN

## 2012-09-10 MED ORDER — LANOLIN HYDROUS EX OINT: 1.0000 "application " | TOPICAL_OINTMENT | CUTANEOUS | Status: DC | PRN

## 2012-09-10 NOTE — Discharge Summary (Signed)
Obstetric Discharge Summary Reason for Admission: cesarean section Prenatal Procedures: ultrasound Intrapartum Procedures: cesarean: low cervical, transverse Postpartum Procedures: none Complications-Operative and Postpartum: none Hemoglobin  Date Value Range Status  09/08/2012 10.5* 12.0 - 15.0 g/dL Final     HCT  Date Value Range Status  09/08/2012 31.2* 36.0 - 46.0 % Final    Physical Exam:  General: alert, cooperative, happy, husband translated Lochia: appropriate Uterine Fundus: firm Incision: healing well DVT Evaluation: No evidence of DVT seen on physical exam. Negative Homan's sign. No cords or calf tenderness.  Discharge Diagnoses: Term Pregnancy-delivered  Discharge Information: Date: 09/10/2012 Activity: pelvic rest Diet:routine for mother; bottle feeding for baby Medications: Ibuprofen and Percocet Condition: stable Instructions: AVS Birth control: Condoms Discharge to: home Follow-up Information    Schedule an appointment as soon as possible for a visit in 6 weeks to follow up. (Follow up with your OB for postpartum check-up)          Newborn Data: Live born female  Birth Weight: 7 lb 9.3 oz (3440 g) APGAR: 9, 9  Home with mother.  Cindy Lane 09/10/2012, 7:47 AM

## 2012-09-10 NOTE — Progress Notes (Signed)
Patient was engorged, I had her ice for 20 minutes and then I set her up with an electric pump.  She was able to soften the breast and pumped off 50 ml.  She was encouraged to feed frequently. Teaching was done extensively.  Donzetta Sprung, R.N.

## 2012-09-11 LAB — TYPE AND SCREEN
ABO/RH(D): A POS
Unit division: 0
Unit division: 0

## 2012-09-19 NOTE — Discharge Summary (Signed)
Attestation of Attending Supervision of Advanced Practitioner: Evaluation and management procedures were performed by the PA/NP/CNM/OB Fellow under my supervision/collaboration. Chart reviewed and agree with management and plan.  Tilda Burrow 09/19/2012 10:45 PM   Attestation of Attending Supervision of Advanced Practitioner: Evaluation and management procedures were performed by the PA/NP/CNM/OB Fellow under my supervision/collaboration. Chart reviewed and agree with management and plan.  Arelie Kuzel V 09/19/2012 10:45 PM

## 2014-09-29 ENCOUNTER — Encounter (HOSPITAL_COMMUNITY): Payer: Self-pay | Admitting: Family Medicine

## 2022-02-07 LAB — OB RESULTS CONSOLE RUBELLA ANTIBODY, IGM: Rubella: IMMUNE

## 2022-02-07 LAB — OB RESULTS CONSOLE HIV ANTIBODY (ROUTINE TESTING): HIV: NONREACTIVE

## 2022-02-07 LAB — OB RESULTS CONSOLE ANTIBODY SCREEN: Antibody Screen: NEGATIVE

## 2022-02-07 LAB — OB RESULTS CONSOLE HEPATITIS B SURFACE ANTIGEN: Hepatitis B Surface Ag: NEGATIVE

## 2022-02-07 LAB — HEPATITIS C ANTIBODY: HCV Ab: NEGATIVE

## 2022-02-07 LAB — OB RESULTS CONSOLE RPR: RPR: NONREACTIVE

## 2022-02-07 LAB — OB RESULTS CONSOLE GC/CHLAMYDIA
Chlamydia: NEGATIVE
Neisseria Gonorrhea: NEGATIVE

## 2022-02-07 LAB — OB RESULTS CONSOLE ABO/RH: RH Type: POSITIVE

## 2022-05-20 ENCOUNTER — Encounter (HOSPITAL_COMMUNITY): Payer: Self-pay | Admitting: *Deleted

## 2022-05-20 ENCOUNTER — Telehealth (HOSPITAL_COMMUNITY): Payer: Self-pay | Admitting: *Deleted

## 2022-05-20 NOTE — Telephone Encounter (Signed)
Preadmission screen  

## 2022-05-20 NOTE — Pre-Procedure Instructions (Signed)
Interpreter number [phone]

## 2022-05-24 ENCOUNTER — Telehealth (HOSPITAL_COMMUNITY): Payer: Self-pay | Admitting: *Deleted

## 2022-05-25 ENCOUNTER — Encounter (HOSPITAL_COMMUNITY): Payer: Self-pay

## 2022-05-25 NOTE — Pre-Procedure Instructions (Signed)
Interpreter number [phone]

## 2022-06-10 ENCOUNTER — Other Ambulatory Visit (HOSPITAL_COMMUNITY)
Admission: RE | Admit: 2022-06-10 | Discharge: 2022-06-10 | Disposition: A | Discharge status: Home / Self Care | Payer: Self-pay | Source: Ambulatory Visit | Attending: Family Medicine | Admitting: Family Medicine

## 2022-06-10 DIAGNOSIS — Z3A00 Weeks of gestation of pregnancy not specified: Secondary | ICD-10-CM | POA: Insufficient documentation

## 2022-06-10 DIAGNOSIS — Z01812 Encounter for preprocedural laboratory examination: Secondary | ICD-10-CM | POA: Insufficient documentation

## 2022-06-10 DIAGNOSIS — O34219 Maternal care for unspecified type scar from previous cesarean delivery: Secondary | ICD-10-CM | POA: Insufficient documentation

## 2022-06-10 LAB — TYPE AND SCREEN
ABO/RH(D): A POS
Antibody Screen: NEGATIVE

## 2022-06-10 LAB — CBC
HCT: 38.6 % (ref 36.0–46.0)
Hemoglobin: 12.8 g/dL (ref 12.0–15.0)
MCH: 28.1 pg (ref 26.0–34.0)
MCHC: 33.2 g/dL (ref 30.0–36.0)
MCV: 84.6 fL (ref 80.0–100.0)
Platelets: 309 10*3/uL (ref 150–400)
RBC: 4.56 MIL/uL (ref 3.87–5.11)
RDW: 16.1 % — ABNORMAL HIGH (ref 11.5–15.5)
WBC: 8.6 10*3/uL (ref 4.0–10.5)
nRBC: 0 % (ref 0.0–0.2)

## 2022-06-11 ENCOUNTER — Encounter (HOSPITAL_COMMUNITY): Payer: Self-pay | Admitting: Obstetrics and Gynecology

## 2022-06-11 ENCOUNTER — Other Ambulatory Visit: Payer: Self-pay

## 2022-06-11 ENCOUNTER — Inpatient Hospital Stay (HOSPITAL_COMMUNITY)
Admission: AD | Admit: 2022-06-11 | Discharge: 2022-06-11 | Disposition: A | Discharge status: Home / Self Care | Payer: Self-pay | Attending: Obstetrics and Gynecology | Admitting: Obstetrics and Gynecology

## 2022-06-11 DIAGNOSIS — O471 False labor at or after 37 completed weeks of gestation: Secondary | ICD-10-CM | POA: Insufficient documentation

## 2022-06-11 DIAGNOSIS — Z3A38 38 weeks gestation of pregnancy: Secondary | ICD-10-CM

## 2022-06-11 DIAGNOSIS — O479 False labor, unspecified: Secondary | ICD-10-CM

## 2022-06-11 DIAGNOSIS — O34219 Maternal care for unspecified type scar from previous cesarean delivery: Secondary | ICD-10-CM | POA: Insufficient documentation

## 2022-06-11 LAB — RPR: RPR Ser Ql: NONREACTIVE

## 2022-06-11 NOTE — MAU Note (Signed)
Cindy Lane is a 36 y.o. at [redacted]w[redacted]d here in MAU reporting: she's having ctxs that are 10-15 minutes apart since 0400 this morning.  Denies LOF or VB, but red discharge with wiping.  Endorses +FM. LMP: N/A Onset of complaint: today Pain score: 4/10 Vitals:   06/11/22 0855  BP: 116/68  Pulse: 66  Resp: 20  Temp: 98.3 F (36.8 C)  SpO2: 98%     FHT: 140 bpm Lab orders placed from triage:   None

## 2022-06-11 NOTE — MAU Provider Note (Signed)
MAU Labor Check   S: Ms. Cindy Lane is a 36 y.o. G3P1002 at [redacted]w[redacted]d  who presents to MAU today complaining contractions q 10-15 minutes since this am. She denies vaginal bleeding, but has had some pink tinged discharge. She denies LOF. She reports normal fetal movement.  Scheduled for repeat CS on Monday.   O: BP 116/68 (BP Location: Right Arm)   Pulse 66   Temp 98.3 F (36.8 C) (Oral)   Resp 20   Wt 74.1 kg   SpO2 98%   BMI 32.98 kg/m   Cervical exam:  Dilation: 1.5 Effacement (%): Thick Exam by:: E.Edwards,RN Unchanged x2 >1 hr apart   Fetal Monitoring: Baseline: 130 Variability: moderate Accelerations: 15x15 Decelerations: None Contractions: About 5 ctx total on toco since arrival, ~ 10-25 minutes apart Appears comfortable w/ and w/o ctx per RN.    A: SIUP at [redacted]w[redacted]d  False labor Reactive NST  Previous CS   P: Discharge home with return precautions.  Return on Monday for repeat C/S or sooner if contractions increase.   Allayne Stack, DO 06/11/2022 11:09 AM

## 2022-06-12 ENCOUNTER — Encounter (HOSPITAL_COMMUNITY): Payer: Self-pay | Admitting: Obstetrics & Gynecology

## 2022-06-12 ENCOUNTER — Inpatient Hospital Stay (HOSPITAL_COMMUNITY): Admission: RE | Admit: 2022-06-12 | Payer: Self-pay | Source: Home / Self Care | Admitting: Family Medicine

## 2022-06-12 ENCOUNTER — Encounter (HOSPITAL_COMMUNITY)
Admission: RE | Disposition: A | Discharge status: Home / Self Care | Payer: Self-pay | Source: Home / Self Care | Attending: Obstetrics & Gynecology

## 2022-06-12 ENCOUNTER — Inpatient Hospital Stay (HOSPITAL_COMMUNITY): Payer: Medicaid Other | Admitting: Anesthesiology

## 2022-06-12 ENCOUNTER — Inpatient Hospital Stay (HOSPITAL_COMMUNITY)
Admission: RE | Admit: 2022-06-12 | Discharge: 2022-06-14 | DRG: 788 | Disposition: A | Discharge status: Home / Self Care | Payer: Medicaid Other | Attending: Obstetrics & Gynecology | Admitting: Obstetrics & Gynecology

## 2022-06-12 ENCOUNTER — Other Ambulatory Visit: Payer: Self-pay

## 2022-06-12 DIAGNOSIS — Z3A39 39 weeks gestation of pregnancy: Secondary | ICD-10-CM

## 2022-06-12 DIAGNOSIS — D509 Iron deficiency anemia, unspecified: Secondary | ICD-10-CM | POA: Diagnosis present

## 2022-06-12 DIAGNOSIS — O9902 Anemia complicating childbirth: Secondary | ICD-10-CM | POA: Diagnosis present

## 2022-06-12 DIAGNOSIS — D649 Anemia, unspecified: Secondary | ICD-10-CM

## 2022-06-12 DIAGNOSIS — R030 Elevated blood-pressure reading, without diagnosis of hypertension: Secondary | ICD-10-CM | POA: Diagnosis present

## 2022-06-12 DIAGNOSIS — O900 Disruption of cesarean delivery wound: Secondary | ICD-10-CM | POA: Diagnosis present

## 2022-06-12 DIAGNOSIS — O26893 Other specified pregnancy related conditions, third trimester: Secondary | ICD-10-CM | POA: Diagnosis present

## 2022-06-12 DIAGNOSIS — O99892 Other specified diseases and conditions complicating childbirth: Secondary | ICD-10-CM | POA: Diagnosis present

## 2022-06-12 DIAGNOSIS — O34211 Maternal care for low transverse scar from previous cesarean delivery: Secondary | ICD-10-CM

## 2022-06-12 DIAGNOSIS — O09513 Supervision of elderly primigravida, third trimester: Secondary | ICD-10-CM

## 2022-06-12 DIAGNOSIS — Z98891 History of uterine scar from previous surgery: Secondary | ICD-10-CM

## 2022-06-12 LAB — COMPREHENSIVE METABOLIC PANEL
ALT: 18 U/L (ref 0–44)
AST: 20 U/L (ref 15–41)
Albumin: 2.5 g/dL — ABNORMAL LOW (ref 3.5–5.0)
Alkaline Phosphatase: 185 U/L — ABNORMAL HIGH (ref 38–126)
Anion gap: 13 (ref 5–15)
BUN: 9 mg/dL (ref 6–20)
CO2: 19 mmol/L — ABNORMAL LOW (ref 22–32)
Calcium: 9.2 mg/dL (ref 8.9–10.3)
Chloride: 105 mmol/L (ref 98–111)
Creatinine, Ser: 0.65 mg/dL (ref 0.44–1.00)
GFR, Estimated: >60 mL/min (ref >60)
Glucose, Bld: 106 mg/dL — ABNORMAL HIGH (ref 70–99)
Potassium: 4.2 mmol/L (ref 3.5–5.1)
Sodium: 137 mmol/L (ref 135–145)
Total Bilirubin: 0.3 mg/dL (ref 0.3–1.2)
Total Protein: 6.9 g/dL (ref 6.5–8.1)

## 2022-06-12 LAB — CBC
HCT: 39.3 % (ref 36.0–46.0)
Hemoglobin: 13 g/dL (ref 12.0–15.0)
MCH: 28.3 pg (ref 26.0–34.0)
MCHC: 33.1 g/dL (ref 30.0–36.0)
MCV: 85.4 fL (ref 80.0–100.0)
Platelets: 295 10*3/uL (ref 150–400)
RBC: 4.6 MIL/uL (ref 3.87–5.11)
RDW: 15.9 % — ABNORMAL HIGH (ref 11.5–15.5)
WBC: 16.9 10*3/uL — ABNORMAL HIGH (ref 4.0–10.5)
nRBC: 0 % (ref 0.0–0.2)

## 2022-06-12 SURGERY — Surgical Case
Anesthesia: Spinal

## 2022-06-12 MED ORDER — FENTANYL CITRATE (PF) 100 MCG/2ML IJ SOLN
INTRAMUSCULAR | Status: DC | PRN
Start: 2022-06-12 — End: 2022-06-12
  Administered 2022-06-12: 15 ug via INTRATHECAL

## 2022-06-12 MED ORDER — EPHEDRINE SULFATE (PRESSORS) 50 MG/ML IJ SOLN
INTRAMUSCULAR | Status: DC | PRN
  Administered 2022-06-12 (×2): 5 mg via INTRAVENOUS

## 2022-06-12 MED ORDER — CEFAZOLIN SODIUM-DEXTROSE 2-4 GM/100ML-% IV SOLN
2.0000 g | INTRAVENOUS | Status: AC
  Administered 2022-06-12: 2 g via INTRAVENOUS
  Filled 2022-06-12: qty 100

## 2022-06-12 MED ORDER — SCOPOLAMINE 1 MG/3DAYS TD PT72
MEDICATED_PATCH | TRANSDERMAL | Status: AC
  Filled 2022-06-12: qty 1

## 2022-06-12 MED ORDER — HYDROMORPHONE HCL 1 MG/ML IJ SOLN: 0.2500 mg | INTRAMUSCULAR | Status: DC | PRN

## 2022-06-12 MED ORDER — OXYTOCIN-SODIUM CHLORIDE 30-0.9 UT/500ML-% IV SOLN
INTRAVENOUS | Status: DC | PRN
  Administered 2022-06-12: 250 mL/h via INTRAVENOUS

## 2022-06-12 MED ORDER — MORPHINE SULFATE (PF) 0.5 MG/ML IJ SOLN
INTRAMUSCULAR | Status: DC | PRN
  Administered 2022-06-12: 150 mg via INTRATHECAL

## 2022-06-12 MED ORDER — SCOPOLAMINE 1 MG/3DAYS TD PT72
1.0000 | MEDICATED_PATCH | Freq: Once | TRANSDERMAL | Status: DC
  Administered 2022-06-12: 1.5 mg via TRANSDERMAL

## 2022-06-12 MED ORDER — IBUPROFEN 600 MG PO TABS
600.0000 mg | ORAL_TABLET | Freq: Four times a day (QID) | ORAL | Status: DC
  Administered 2022-06-13 – 2022-06-14 (×4): 600 mg via ORAL
  Filled 2022-06-12 (×4): qty 1

## 2022-06-12 MED ORDER — DIBUCAINE (PERIANAL) 1 % EX OINT
1.0000 | TOPICAL_OINTMENT | CUTANEOUS | Status: DC | PRN
Start: 2022-06-12 — End: 2022-06-14

## 2022-06-12 MED ORDER — EPHEDRINE 5 MG/ML INJ
INTRAVENOUS | Status: AC
  Filled 2022-06-12: qty 5

## 2022-06-12 MED ORDER — DEXAMETHASONE SODIUM PHOSPHATE 10 MG/ML IJ SOLN
INTRAMUSCULAR | Status: AC
  Filled 2022-06-12: qty 1

## 2022-06-12 MED ORDER — ONDANSETRON HCL 4 MG/2ML IJ SOLN: 4.0000 mg | Freq: Once | INTRAMUSCULAR | Status: DC | PRN

## 2022-06-12 MED ORDER — SIMETHICONE 80 MG PO CHEW: 80.0000 mg | CHEWABLE_TABLET | ORAL | Status: DC | PRN

## 2022-06-12 MED ORDER — SENNOSIDES-DOCUSATE SODIUM 8.6-50 MG PO TABS
2.0000 | ORAL_TABLET | Freq: Every day | ORAL | Status: DC
  Administered 2022-06-13 – 2022-06-14 (×2): 2 via ORAL
  Filled 2022-06-12 (×2): qty 2

## 2022-06-12 MED ORDER — MEPERIDINE HCL 25 MG/ML IJ SOLN: 6.2500 mg | INTRAMUSCULAR | Status: DC | PRN

## 2022-06-12 MED ORDER — ATROPINE SULFATE 0.4 MG/ML IV SOLN
INTRAVENOUS | Status: AC
  Filled 2022-06-12: qty 1

## 2022-06-12 MED ORDER — OXYCODONE HCL 5 MG/5ML PO SOLN: 5.0000 mg | Freq: Once | ORAL | Status: DC | PRN

## 2022-06-12 MED ORDER — ENOXAPARIN SODIUM 40 MG/0.4ML IJ SOSY
40.0000 mg | PREFILLED_SYRINGE | INTRAMUSCULAR | Status: DC
  Administered 2022-06-13 – 2022-06-14 (×2): 40 mg via SUBCUTANEOUS
  Filled 2022-06-12 (×2): qty 0.4

## 2022-06-12 MED ORDER — OXYTOCIN-SODIUM CHLORIDE 30-0.9 UT/500ML-% IV SOLN: 2.5000 [IU]/h | INTRAVENOUS | Status: AC

## 2022-06-12 MED ORDER — ACETAMINOPHEN 500 MG PO TABS
1000.0000 mg | ORAL_TABLET | Freq: Four times a day (QID) | ORAL | Status: DC
Start: 2022-06-12 — End: 2022-06-14
  Administered 2022-06-12 – 2022-06-14 (×7): 1000 mg via ORAL
  Filled 2022-06-12 (×7): qty 2

## 2022-06-12 MED ORDER — OXYTOCIN-SODIUM CHLORIDE 30-0.9 UT/500ML-% IV SOLN
INTRAVENOUS | Status: AC
  Filled 2022-06-12: qty 500

## 2022-06-12 MED ORDER — DEXAMETHASONE SODIUM PHOSPHATE 10 MG/ML IJ SOLN
INTRAMUSCULAR | Status: DC | PRN
  Administered 2022-06-12: 10 mg via INTRAVENOUS

## 2022-06-12 MED ORDER — TETANUS-DIPHTH-ACELL PERTUSSIS 5-2.5-18.5 LF-MCG/0.5 IM SUSY: 0.5000 mL | PREFILLED_SYRINGE | Freq: Once | INTRAMUSCULAR | Status: DC

## 2022-06-12 MED ORDER — LACTATED RINGERS IV SOLN: INTRAVENOUS | Status: DC | PRN

## 2022-06-12 MED ORDER — TRANEXAMIC ACID-NACL 1000-0.7 MG/100ML-% IV SOLN
INTRAVENOUS | Status: AC
  Filled 2022-06-12: qty 100

## 2022-06-12 MED ORDER — CEFAZOLIN SODIUM-DEXTROSE 2-4 GM/100ML-% IV SOLN
INTRAVENOUS | Status: AC
  Filled 2022-06-12: qty 100

## 2022-06-12 MED ORDER — OXYCODONE HCL 5 MG PO TABS: 5.0000 mg | ORAL_TABLET | Freq: Once | ORAL | Status: DC | PRN

## 2022-06-12 MED ORDER — BUPIVACAINE IN DEXTROSE 0.75-8.25 % IT SOLN
INTRATHECAL | Status: DC | PRN
  Administered 2022-06-12: 1.5 mL via INTRATHECAL

## 2022-06-12 MED ORDER — COCONUT OIL OIL: 1.0000 | TOPICAL_OIL | Status: DC | PRN

## 2022-06-12 MED ORDER — ACETAMINOPHEN 10 MG/ML IV SOLN
INTRAVENOUS | Status: AC
  Filled 2022-06-12: qty 100

## 2022-06-12 MED ORDER — MEASLES, MUMPS & RUBELLA VAC IJ SOLR: 0.5000 mL | Freq: Once | INTRAMUSCULAR | Status: DC

## 2022-06-12 MED ORDER — SOD CITRATE-CITRIC ACID 500-334 MG/5ML PO SOLN
30.0000 mL | ORAL | Status: AC
  Administered 2022-06-12: 30 mL via ORAL
  Filled 2022-06-12: qty 30

## 2022-06-12 MED ORDER — MENTHOL 3 MG MT LOZG: 1.0000 | LOZENGE | OROMUCOSAL | Status: DC | PRN

## 2022-06-12 MED ORDER — DIPHENHYDRAMINE HCL 25 MG PO CAPS: 25.0000 mg | ORAL_CAPSULE | Freq: Four times a day (QID) | ORAL | Status: DC | PRN

## 2022-06-12 MED ORDER — ACETAMINOPHEN 10 MG/ML IV SOLN
INTRAVENOUS | Status: DC | PRN
  Administered 2022-06-12: 1000 mg via INTRAVENOUS

## 2022-06-12 MED ORDER — LACTATED RINGERS IV SOLN
INTRAVENOUS | Status: DC
  Administered 2022-06-12: 1000 mL via INTRAVENOUS

## 2022-06-12 MED ORDER — WITCH HAZEL-GLYCERIN EX PADS: 1.0000 | MEDICATED_PAD | CUTANEOUS | Status: DC | PRN

## 2022-06-12 MED ORDER — KETOROLAC TROMETHAMINE 30 MG/ML IJ SOLN: 30.0000 mg | Freq: Once | INTRAMUSCULAR | Status: DC | PRN

## 2022-06-12 MED ORDER — LACTATED RINGERS IV SOLN
INTRAVENOUS | Status: DC
Start: 2022-06-12 — End: 2022-06-14

## 2022-06-12 MED ORDER — ONDANSETRON HCL 4 MG/2ML IJ SOLN
INTRAMUSCULAR | Status: AC
  Filled 2022-06-12: qty 2

## 2022-06-12 MED ORDER — MEDROXYPROGESTERONE ACETATE 150 MG/ML IM SUSP: 150.0000 mg | INTRAMUSCULAR | Status: DC | PRN

## 2022-06-12 MED ORDER — FENTANYL CITRATE (PF) 100 MCG/2ML IJ SOLN
INTRAMUSCULAR | Status: AC
  Filled 2022-06-12: qty 2

## 2022-06-12 MED ORDER — TRANEXAMIC ACID-NACL 1000-0.7 MG/100ML-% IV SOLN
1000.0000 mg | INTRAVENOUS | Status: AC
  Administered 2022-06-12: 1000 mg via INTRAVENOUS
  Filled 2022-06-12: qty 100

## 2022-06-12 MED ORDER — KETOROLAC TROMETHAMINE 30 MG/ML IJ SOLN
30.0000 mg | Freq: Four times a day (QID) | INTRAMUSCULAR | Status: AC
  Administered 2022-06-12 – 2022-06-13 (×4): 30 mg via INTRAVENOUS
  Filled 2022-06-12 (×4): qty 1

## 2022-06-12 MED ORDER — PRENATAL MULTIVITAMIN CH
1.0000 | ORAL_TABLET | Freq: Every day | ORAL | Status: DC
  Administered 2022-06-13 – 2022-06-14 (×2): 1 via ORAL
  Filled 2022-06-12 (×2): qty 1

## 2022-06-12 MED ORDER — LACTATED RINGERS IV BOLUS
1000.0000 mL | Freq: Once | INTRAVENOUS | Status: AC
Start: 2022-06-12 — End: 2022-06-12
  Administered 2022-06-12: 1000 mL via INTRAVENOUS

## 2022-06-12 MED ORDER — STERILE WATER FOR IRRIGATION IR SOLN
Status: DC | PRN
  Administered 2022-06-12: 1

## 2022-06-12 MED ORDER — MORPHINE SULFATE (PF) 0.5 MG/ML IJ SOLN
INTRAMUSCULAR | Status: AC
  Filled 2022-06-12: qty 10

## 2022-06-12 MED ORDER — PHENYLEPHRINE HCL-NACL 20-0.9 MG/250ML-% IV SOLN
INTRAVENOUS | Status: DC | PRN
  Administered 2022-06-12: 60 ug/min via INTRAVENOUS

## 2022-06-12 MED ORDER — POVIDONE-IODINE 10 % EX SWAB
2.0000 | Freq: Once | CUTANEOUS | Status: AC
  Administered 2022-06-12: 2 via TOPICAL

## 2022-06-12 MED ORDER — OXYCODONE HCL 5 MG PO TABS: 5.0000 mg | ORAL_TABLET | ORAL | Status: DC | PRN

## 2022-06-12 MED ORDER — ONDANSETRON HCL 4 MG/2ML IJ SOLN
INTRAMUSCULAR | Status: DC | PRN
  Administered 2022-06-12: 4 mg via INTRAVENOUS

## 2022-06-12 MED ORDER — PHENYLEPHRINE HCL-NACL 20-0.9 MG/250ML-% IV SOLN
INTRAVENOUS | Status: AC
  Filled 2022-06-12: qty 250

## 2022-06-12 MED ORDER — SIMETHICONE 80 MG PO CHEW
80.0000 mg | CHEWABLE_TABLET | Freq: Three times a day (TID) | ORAL | Status: DC
Start: 2022-06-12 — End: 2022-06-14
  Administered 2022-06-12 – 2022-06-14 (×6): 80 mg via ORAL
  Filled 2022-06-12 (×6): qty 1

## 2022-06-12 SURGICAL SUPPLY — 35 items
ADH SKN CLS APL DERMABOND .7 (GAUZE/BANDAGES/DRESSINGS) ×1
CHLORAPREP W/TINT 26ML (MISCELLANEOUS) ×6 IMPLANT
CLAMP CORD UMBIL (MISCELLANEOUS) ×3 IMPLANT
CLOTH BEACON ORANGE TIMEOUT ST (SAFETY) ×3 IMPLANT
DERMABOND ADVANCED (GAUZE/BANDAGES/DRESSINGS) ×2
DERMABOND ADVANCED .7 DNX12 (GAUZE/BANDAGES/DRESSINGS) IMPLANT
DRSG OPSITE POSTOP 4X10 (GAUZE/BANDAGES/DRESSINGS) ×3 IMPLANT
ELECT REM PT RETURN 9FT ADLT (ELECTROSURGICAL)
ELECTRODE REM PT RTRN 9FT ADLT (ELECTROSURGICAL) ×2 IMPLANT
EXTRACTOR VACUUM BELL CUP MITY (SUCTIONS) ×1 IMPLANT
GLOVE BIOGEL PI IND STRL 7.0 (GLOVE) ×4 IMPLANT
GLOVE BIOGEL PI INDICATOR 7.0 (GLOVE) ×4
GLOVE ECLIPSE 7.0 STRL STRAW (GLOVE) ×3 IMPLANT
GOWN STRL REUS W/TWL LRG LVL3 (GOWN DISPOSABLE) ×6 IMPLANT
KIT ABG SYR 3ML LUER SLIP (SYRINGE) ×3 IMPLANT
NDL HYPO 25X5/8 SAFETYGLIDE (NEEDLE) ×2 IMPLANT
NEEDLE HYPO 25X5/8 SAFETYGLIDE (NEEDLE) ×2 IMPLANT
NS IRRIG 1000ML POUR BTL (IV SOLUTION) ×3 IMPLANT
PACK C SECTION WH (CUSTOM PROCEDURE TRAY) ×3 IMPLANT
PAD OB MATERNITY 4.3X12.25 (PERSONAL CARE ITEMS) ×3 IMPLANT
RTRCTR C-SECT PINK 25CM LRG (MISCELLANEOUS) ×3 IMPLANT
SUT MNCRL 0 VIOLET CTX 36 (SUTURE) ×4 IMPLANT
SUT MONOCRYL 0 CTX 36 (SUTURE) ×4
SUT PLAIN 0 NONE (SUTURE) IMPLANT
SUT PLAIN 2 0 (SUTURE)
SUT PLAIN 2 0 XLH (SUTURE) IMPLANT
SUT PLAIN ABS 2-0 CT1 27XMFL (SUTURE) IMPLANT
SUT VIC AB 0 CTX 36 (SUTURE) ×2
SUT VIC AB 0 CTX36XBRD ANBCTRL (SUTURE) ×2 IMPLANT
SUT VIC AB 2-0 CT1 27 (SUTURE)
SUT VIC AB 2-0 CT1 TAPERPNT 27 (SUTURE) IMPLANT
SUT VIC AB 4-0 KS 27 (SUTURE) ×3 IMPLANT
TOWEL OR 17X24 6PK STRL BLUE (TOWEL DISPOSABLE) ×3 IMPLANT
TRAY FOLEY W/BAG SLVR 14FR LF (SET/KITS/TRAYS/PACK) IMPLANT
WATER STERILE IRR 1000ML POUR (IV SOLUTION) ×3 IMPLANT

## 2022-06-12 NOTE — H&P (Signed)
OBSTETRIC ADMISSION HISTORY AND PHYSICAL  Cindy Lane is a 36 y.o. female G3P2002 with IUP at [redacted]w[redacted]d by [place] presenting for contractions. She reports +FMs and no LOF. She reports having some bloody show over the last day or so. She plans on breast and formula feeding. She is undecided on her birth control method postpartum.   She received her prenatal care at [facility].  Dating: By [place] --->  Estimated Date of Delivery: 06/19/22  Sono: @[redacted]w[redacted]d , normal anatomy, cephalic presentation, posterior placental lie  Prenatal History/Complications:  Hx of CS x 2 AMA  Past Medical History: Past Medical History:  Diagnosis Date   Anemia    GERD (gastroesophageal reflux disease)    pregnancy    Past Surgical History: Past Surgical History:  Procedure Laterality Date   CESAREAN SECTION     CESAREAN SECTION  09/07/2012   Procedure: CESAREAN SECTION;  Surgeon: 11/07/2012, MD;  Location: [facility];  Service: Obstetrics;  Laterality: N/A;  repeat    Obstetrical History: OB History     Gravida  3   Para  2   Term  1   Preterm      AB      Living  2      SAB      IAB      Ectopic      Multiple      Live Births  2           Social History Social History   Socioeconomic History   Marital status: Married    Spouse name: Not on file   Number of children: Not on file   Years of education: Not on file   Highest education level: Not on file  Occupational History   Not on file  Tobacco Use   Smoking status: Never   Smokeless tobacco: Never  Vaping Use   Vaping Use: Never used  Substance and Sexual Activity   Alcohol use: No   Drug use: No   Sexual activity: Yes  Other Topics Concern   Not on file  Social History Narrative   Not on file   Social Determinants of Health   Financial Resource Strain: Not on file  Food Insecurity: Not on file  Transportation Needs: Not on file  Physical Activity: Not on file  Stress: Not on file  Social Connections: Not on  file    Family History: History reviewed. No pertinent family history.  Allergies: No Known Allergies  Medications Prior to Admission  Medication Sig Dispense Refill Last Dose   acetaminophen (TYLENOL) 500 MG tablet Take 500 mg by mouth every 6 (six) hours as needed.   06/11/2022   Prenatal Vit-Fe Fumarate-FA (PRENATAL MULTIVITAMIN) TABS tablet Take 1 tablet by mouth daily at 12 noon.   06/11/2022     Review of Systems  All systems reviewed and negative except as stated in HPI  Blood pressure 138/62, pulse 63, temperature 98.4 F (36.9 C), temperature source Oral, resp. rate 16, height 5\' 2"  (1.575 m), weight 73.9 kg, SpO2 100 %, unknown if currently breastfeeding.  General appearance: alert, cooperative, and no distress Lungs: normal work of breathing on room air  Heart: normal rate, warm and well perfused  Abdomen: soft, non-tender, gravid  Extremities: no LE edema or calf tenderness to palpation   Fetal monitoring: Baseline 150 bpm, moderate variability, + accels, no decels  Uterine activity: Every 3-6 minutes   Dilation: 2.5 Effacement (%): 70 Station: -3 Exam by::  [name]  Prenatal labs: ABO, Rh: --/--/A POS (07/14 1126) Antibody: NEG (07/14 1126) Rubella: Immune (03/13 0000) RPR: NON REACTIVE (07/14 1126)  HBsAg: Negative (03/13 0000)  HIV: Non-reactive (03/13 0000)  GBS:  Negative  1 hr Glucola normal  Genetic screening - LR NIPS, Horizon negative  Anatomy US normal   Prenatal Transfer Tool  Maternal Diabetes: No Genetic Screening: Normal Maternal Ultrasounds/Referrals: Normal Fetal Ultrasounds or other Referrals:  None Maternal Substance Abuse:  No Significant Maternal Medications:  None Significant Maternal Lab Results: Group B Strep negative  No results found for this or any previous visit (from the past 24 hour(s)).  Patient Active Problem List   Diagnosis Date Noted   History of cesarean delivery 06/12/2022   Previous cesarean delivery,  antepartum condition or complication 09/07/2012    Assessment/Plan:  Cindy Lane is a 36 y.o. G3P1002 at [redacted]w[redacted]d here for contractions. Previously scheduled for repeat CS on 7/17. Given increased contractions and early labor, will proceed with delivery.   The risks of surgery were discussed with the patient including but were not limited to: bleeding which may require transfusion or reoperation; infection which may require antibiotics; injury to bowel, bladder, ureters or other surrounding organs; injury to the fetus; need for additional procedures including hysterectomy in the event of a life-threatening hemorrhage; formation of adhesions; placental abnormalities with subsequent pregnancies; incisional problems; thromboembolic phenomenon and other postoperative/anesthesia complications.  The patient concurred with the proposed plan, giving informed written consent for the procedure.   Patient has been NPO since 7 pm last evening and she will remain NPO for procedure. Anesthesia and OR aware. Preoperative prophylactic antibiotics and SCDs ordered on call to the OR.  To OR when ready.  #Pain: Per anesthesia  #FWB: Cat 1 #ID:  GBS neg; Ancef for surgical prophylaxis  #MOF: Breast and formula  #MOC: Unsure   #Elevated BP: Mild range BP initially in MAU. CBC, CMP and urine P:C ratio ordered. No symptoms of pre-eclampsia. Will monitor closely.   [employer] Spanish interpreter Clint Lipps, [phone]) used for encounter.   Worthy Rancher, MD  06/12/2022, 5:10 AM

## 2022-06-12 NOTE — Anesthesia Postprocedure Evaluation (Signed)
Anesthesia Post Note  Patient: Cindy Lane  Procedure(s) Performed: CESAREAN SECTION     Patient location during evaluation: PACU Anesthesia Type: Spinal Level of consciousness: oriented and awake and alert Pain management: pain level controlled Vital Signs Assessment: post-procedure vital signs reviewed and stable Respiratory status: spontaneous breathing, respiratory function stable and patient connected to nasal cannula oxygen Cardiovascular status: blood pressure returned to baseline and stable Postop Assessment: no headache, no backache and no apparent nausea or vomiting Anesthetic complications: no   No notable events documented.  Last Vitals:  Vitals:   06/12/22 0925 06/12/22 1048  BP: 136/86 132/70  Pulse: 61 64  Resp: 16 16  Temp: 37.3 C 37.1 C  SpO2: 98% 97%    Last Pain:  Vitals:   06/12/22 1048  TempSrc: Oral  PainSc: 0-No pain                 Geniva Lohnes

## 2022-06-12 NOTE — Transfer of Care (Signed)
Immediate Anesthesia Transfer of Care Note  Patient: Cindy Lane  Procedure(s) Performed: CESAREAN SECTION  Patient Location: PACU  Anesthesia Type:Spinal  Level of Consciousness: awake, alert  and oriented  Airway & Oxygen Therapy: Patient Spontanous Breathing  Post-op Assessment: Report given to RN and Post -op Vital signs reviewed and stable  Post vital signs: Reviewed and stable  Last Vitals:  Vitals Value Taken Time  BP 124/71 06/12/22 0727  Temp    Pulse 66 06/12/22 0731  Resp 22 06/12/22 0731  SpO2 99 % 06/12/22 0731  Vitals shown include unvalidated device data.  Last Pain:  Vitals:   06/12/22 0300  TempSrc: Oral         Complications: No notable events documented.

## 2022-06-12 NOTE — MAU Note (Addendum)
Preped for OR/ Dr.Albert in consented pt for repeat CS  Interpreter per [employer]

## 2022-06-12 NOTE — Op Note (Signed)
Bolivia Lane  PROCEDURE DATE: 06/12/2022  PREOPERATIVE DIAGNOSES: Intrauterine pregnancy at [redacted]w[redacted]d weeks gestation; history of cesarean section x 2; desire for repeat cesarean section; early labor   POSTOPERATIVE DIAGNOSES: The same  PROCEDURE: Repeat Low Transverse Cesarean Section  SURGEON:  Dr. Duane Lope  ASSISTANT:  Dr. Evalina Field  An experienced assistant was required given the standard of surgical care given the complexity of the case.  This assistant was needed for exposure, dissection, suctioning, retraction, instrument exchange, assisting with delivery with administration of fundal pressure, and for overall help during the procedure.  ANESTHESIOLOGY TEAM: Anesthesiologist: Bethena Midget, MD CRNA: Elbert Ewings, CRNA; Rica Records, CRNA  INDICATIONS: Cindy Lane is a 36 y.o. G3P2003 at [redacted]w[redacted]d here for cesarean section secondary to the indications listed under preoperative diagnoses; please see preoperative note for further details.  The risks of cesarean section were discussed with the patient including but were not limited to: bleeding which may require transfusion or reoperation; infection which may require antibiotics; injury to bowel, bladder, ureters or other surrounding organs; injury to the fetus; need for additional procedures including hysterectomy in the event of a life-threatening hemorrhage; placental abnormalities wth subsequent pregnancies, incisional problems, thromboembolic phenomenon and other postoperative/anesthesia complications.   The patient concurred with the proposed plan, giving informed written consent for the procedure.    FINDINGS:  Viable female infant in cephalic presentation.  Apgars 10 and 10.  Meconium stained amniotic fluid.  Intact placenta, three vessel cord.  Uterine dehiscence noted upon entry into peritoneum.  Normal fallopian tubes and ovaries bilaterally.  Moderate adhesive disease present between fascia and  underlying rectus muscles.   Due to the presence of uterine dehiscence at time of delivery, recommend scheduled repeat cesarean delivery at [redacted] weeks gestation should patient become pregnant again in the future.    ANESTHESIA: Spinal  INTRAVENOUS FLUIDS: 500 ml   ESTIMATED BLOOD LOSS: 107 ml URINE OUTPUT:  250 ml SPECIMENS: Placenta sent to L&D  COMPLICATIONS: None immediate  PROCEDURE IN DETAIL:   The patient preoperatively received intravenous antibiotics and had sequential compression devices applied to her lower extremities.  She was then taken to the operating room where spinal anesthesia was administered and was found to be adequate. She was then placed in a dorsal supine position with a leftward tilt, and prepped and draped in a sterile manner.  A foley catheter was placed into her bladder and attached to constant gravity.    After an adequate timeout was performed, a Pfannenstiel skin incision was made with a scalpel on her preexisting scar and carried through to the underlying layer of fascia. The fascia was incised in the midline, and this incision was extended bilaterally bluntly.  The rectus muscles were separated in the midline and the peritoneum was entered bluntly. Unable to place Alexis retractor due to adhesions superiorly.  Rich retractor placed to assist with visualization.  Upon entry into the peritoneum, uterine dehiscence noted. Metzenbaum scissors were used to sharply enter through the overlying membraneous layer. Meconium stained fluid noted.   Difficulty was encountered delivering the head.  A Kiwi vacuum was then placed in appropriate position, and after 1 pull without pop off, the infant was successfully delivered. The cord was clamped and cut after one minute, and the infant was handed over to the awaiting neonatology team. Uterine massage was then administered, and the placenta delivered intact with a three-vessel cord. The uterus was then cleared of clots and debris and  exteriorized.   The  myometrium at the site of the uterine dehiscence was closed with 0 Monocryl in a running locked fashion. The pelvis was cleared of all clot and debris. Hemostasis was confirmed on all surfaces. The uterus was returned to the abdomen.   The fascia was then closed using 0 Vicryl in a running fashion, and the skin was closed with a 4-0 Monocryl subcuticular stitch. The patient tolerated the procedure well. Sponge, instrument and needle counts were correct x 3.  She was taken to the recovery room in stable condition.   Evalina Field, MD  OB Fellow  Faculty Practice

## 2022-06-12 NOTE — Anesthesia Preprocedure Evaluation (Signed)
Anesthesia Evaluation  Patient identified by MRN, date of birth, ID band Patient awake    Reviewed: Allergy & Precautions, H&P , NPO status , Patient's Chart, lab work & pertinent test results  Airway Mallampati: I  TM Distance: >3 FB Neck ROM: full    Dental no notable dental hx.    Pulmonary neg pulmonary ROS,    Pulmonary exam normal        Cardiovascular negative cardio ROS Normal cardiovascular exam     Neuro/Psych negative neurological ROS  negative psych ROS   GI/Hepatic Neg liver ROS, GERD  ,  Endo/Other  negative endocrine ROS  Renal/GU negative Renal ROS  negative genitourinary   Musculoskeletal negative musculoskeletal ROS (+)   Abdominal Normal abdominal exam  (+)   Peds negative pediatric ROS (+)  Hematology  (+) Blood dyscrasia, anemia ,   Anesthesia Other Findings   Reproductive/Obstetrics (+) Pregnancy                             Anesthesia Physical  Anesthesia Plan  ASA: 2 and emergent  Anesthesia Plan: Spinal   Post-op Pain Management:    Induction: Intravenous  PONV Risk Score and Plan: Treatment may vary due to age or medical condition  Airway Management Planned: Natural Airway  Additional Equipment: None  Intra-op Plan:   Post-operative Plan:   Informed Consent: I have reviewed the patients History and Physical, chart, labs and discussed the procedure including the risks, benefits and alternatives for the proposed anesthesia with the patient or authorized representative who has indicated his/her understanding and acceptance.       Plan Discussed with: CRNA, Surgeon and Anesthesiologist  Anesthesia Plan Comments: (  )        Anesthesia Quick Evaluation

## 2022-06-12 NOTE — MAU Note (Signed)
To OR via stretcher-IV RFA patent with 2nd bag of LR infusing at 125

## 2022-06-12 NOTE — MAU Note (Addendum)
.  Cindy Lane is a 36 y.o. at [redacted]w[redacted]d here in MAU reporting: contractions that began 0400 yesterday-seen in MAU-told to return if contractions were stronger. 1900 intensity increased. Denies SROM. Reports passing small amount of red tinged mucous. +fetal movement. For a repeat CS at 1030 scheduled on Monday. SVE prev. 1.5 Onset of complaint: 1900 Pain score: 7  Vitals:   06/12/22 0300  BP: (!) 144/81  Pulse: (!) 56  Resp: 16  Temp: 98.4 F (36.9 C)     FHT:139bpm Lab orders placed from triage:  mau labor

## 2022-06-12 NOTE — Discharge Summary (Shared)
Postpartum Discharge Summary  Date of Service updated***     Patient Name: Cindy Lane DOB: Nov 04, 1986 MRN: [id number]  Date of admission: 06/12/2022 Delivery date:06/12/2022  Delivering provider: Florian Buff  Date of discharge: 06/12/2022  Admitting diagnosis: History of cesarean delivery [Z98.891] Intrauterine pregnancy: [redacted]w[redacted]d     Secondary diagnosis:  Principal Problem:   Status post repeat low transverse cesarean section Active Problems:   History of cesarean delivery   Dehiscence of uterine wound   Elevated BP without diagnosis of hypertension  Additional problems: ***    Discharge diagnosis: Term Pregnancy Delivered                                              Post partum procedures: {Postpartum procedures:23558} Augmentation: N/A Complications: None  Hospital course: Sceduled C/S - 36 y.o. yo G3P3003 at [redacted]w[redacted]d was admitted to the hospital 06/12/2022 for scheduled cesarean section with the following indication: History of CS x 2, early labor.  Delivery details are as follows:  Membrane Rupture Time/Date:  ,   Delivery Method:C-Section, Vacuum Assisted  Details of operation can be found in separate operative note.  Patient had an uncomplicated postpartum course.  Her hemoglobin on POD#1 was ***, for which she received ***.  Her BP was ***.  She is ambulating, tolerating a regular diet, passing flatus, and urinating well.  Her pain and bleeding are controlled.  She is breast and formula feeding well***.  Patient is discharged home in stable condition on  06/12/22        Newborn Data: Birth date:06/12/2022  Birth time:6:39 AM  Gender:Female  Living status:Living  Apgars:10 ,10  Weight:2930 g     Magnesium Sulfate received: No BMZ received: No Rhophylac: N/A MMR: N/A - Immune  T-DaP:Given prenatally Flu: Given prenatally  Transfusion: {Transfusion received:30440034}  Physical exam  Vitals:   06/12/22 0830 06/12/22 0845 06/12/22 0900 06/12/22 0925   BP: 138/66 131/79  136/86  Pulse: (!) 59 66  61  Resp: $Remo'20 17  16  'FhNLX$ Temp: 98.3 F (36.8 C)  98.2 F (36.8 C) 99.1 F (37.3 C)  TempSrc: Oral  Oral Oral  SpO2: 97%   98%  Weight:      Height:       General: {Exam; general:21111117} Lochia: {Desc; appropriate/inappropriate:30686::"appropriate"} Uterine Fundus: {Desc; firm/soft:30687} Incision: {Exam; incision:21111123} DVT Evaluation: {Exam; WUJ:8119147}  Labs: Lab Results  Component Value Date   WBC 16.9 (H) 06/12/2022   HGB 13.0 06/12/2022   HCT 39.3 06/12/2022   MCV 85.4 06/12/2022   PLT 295 06/12/2022      Latest Ref Rng & Units 06/12/2022    5:09 AM  CMP  Glucose 70 - 99 mg/dL 106   BUN 6 - 20 mg/dL 9   Creatinine 0.44 - 1.00 mg/dL 0.65   Sodium 135 - 145 mmol/L 137   Potassium 3.5 - 5.1 mmol/L 4.2   Chloride 98 - 111 mmol/L 105   CO2 22 - 32 mmol/L 19   Calcium 8.9 - 10.3 mg/dL 9.2   Total Protein 6.5 - 8.1 g/dL 6.9   Total Bilirubin 0.3 - 1.2 mg/dL 0.3   Alkaline Phos 38 - 126 U/L 185   AST 15 - 41 U/L 20   ALT 0 - 44 U/L 18    Edinburgh Score:     No data to display  After visit meds:  Allergies as of 06/12/2022   No Known Allergies   Med Rec must be completed prior to using this [facility]***       Discharge home in stable condition Infant Feeding: Bottle and Breast Infant Disposition: {CHL IP OB HOME WITH ZGYFVC:94496} Discharge instruction: per After Visit Summary and Postpartum booklet. Activity: Advance as tolerated. Pelvic rest for 6 weeks.  Diet: routine diet Future Appointments:No future appointments. Follow up Visit: Patient will follow up with [facility] in 6 weeks for routine postpartum visit.   Additional Postpartum F/U: Incision check 1 week and BP check 1 week (will complete at [facility] office, message sent) High risk pregnancy complicated by: Hx of CS x 2 Delivery mode:  C-Section, Vacuum Assisted  Anticipated Birth Control:  Unsure  06/12/2022 Genia Del,  MD

## 2022-06-12 NOTE — Lactation Note (Addendum)
This note was copied from a baby's chart. Lactation Consultation Note  Patient Name: Cindy Lane Today's Date: 06/12/2022 Reason for consult: Initial assessment;Term Age:36 hours Consult was done in Spanish:  Initial visit to 4 hours old infant of a P3 mother. Mother reports breastfeeding for 2 years her older children. Per mother, infant has been latching well. LC provided a hand pump for supplementation. Demonstrated hand expression and colostrum is easily expressed.  Reviewed normal newborn behavior during first 24h, expected output, tummy size and feeding frequency. Mother plans to use formula, talked about pace bottled feeding, frequent burping and upright position.   Plan: 1-Skin to skin, aim for a deep, comfortable latch and breastfeed on demand or 8-12 times in 24h period. 2-Encouraged maternal rest, hydration and food intake.  3-If formula feeding, volume according to age, pace bottled feeding, frequent burping and upright position.  Contact LC as needed for feeds/support/concerns/questionsAll questions answered at this time. Provided Lactation services brochure.  Maternal Data Has patient been taught Hand Expression?: Yes Does the patient have breastfeeding experience prior to this delivery?: Yes How long did the patient breastfeed?: 24 months x2  Feeding Mother's Current Feeding Choice: Breast Milk and Formula  Lactation Tools Discussed/Used Tools: Pump;Flanges Flange Size: 24 Breast pump type: Manual Pump Education: Setup, frequency, and cleaning;Milk Storage Reason for Pumping: mother's request Pumping frequency: as needed Pumped volume:  (drops with demonstration)  Interventions Interventions: Breast feeding basics reviewed;Skin to skin;Hand express;Breast massage;Hand pump;Expressed milk;Education;LC Services brochure  Discharge Pump: Manual WIC Program: Yes  Consult Status Consult Status: Follow-up Date: 06/13/22 Follow-up type:  In-patient    Devanee Pomplun A Higuera Ancidey 06/12/2022, 11:29 AM

## 2022-06-12 NOTE — MAU Note (Signed)
CS called

## 2022-06-12 NOTE — Anesthesia Procedure Notes (Signed)
Spinal  Patient location during procedure: OR Start time: 06/12/2022 6:15 AM End time: 06/12/2022 6:21 AM Reason for block: surgical anesthesia Staffing Anesthesiologist: Bethena Midget, MD Performed by: Bethena Midget, MD Authorized by: Bethena Midget, MD   Preanesthetic Checklist Completed: patient identified, IV checked, site marked, risks and benefits discussed, surgical consent, monitors and equipment checked, pre-op evaluation and timeout performed Spinal Block Patient position: sitting Prep: DuraPrep Patient monitoring: heart rate, cardiac monitor, continuous pulse ox and blood pressure Approach: midline Location: L3-4 Injection technique: single-shot Needle Needle type: Sprotte  Needle gauge: 24 G Needle length: 9 cm Assessment Sensory level: T4 Events: CSF return

## 2022-06-13 DIAGNOSIS — O34219 Maternal care for unspecified type scar from previous cesarean delivery: Secondary | ICD-10-CM

## 2022-06-13 LAB — CBC
HCT: 29.7 % — ABNORMAL LOW (ref 36.0–46.0)
Hemoglobin: 10 g/dL — ABNORMAL LOW (ref 12.0–15.0)
MCH: 28.9 pg (ref 26.0–34.0)
MCHC: 33.7 g/dL (ref 30.0–36.0)
MCV: 85.8 fL (ref 80.0–100.0)
Platelets: 195 10*3/uL (ref 150–400)
RBC: 3.46 MIL/uL — ABNORMAL LOW (ref 3.87–5.11)
RDW: 16.3 % — ABNORMAL HIGH (ref 11.5–15.5)
WBC: 18.6 10*3/uL — ABNORMAL HIGH (ref 4.0–10.5)
nRBC: 0 % (ref 0.0–0.2)

## 2022-06-13 MED ORDER — FERROUS SULFATE 325 (65 FE) MG PO TABS
325.0000 mg | ORAL_TABLET | ORAL | Status: DC
  Administered 2022-06-14: 325 mg via ORAL
  Filled 2022-06-13: qty 1

## 2022-06-13 NOTE — Progress Notes (Signed)
POSTPARTUM PROGRESS NOTE  POD #1  Subjective:  Cindy Lane is a 36 y.o. G3P2003 s/p repeat LTCS at [redacted]w[redacted]d.  She reports she doing well. No acute events overnight. She reports she is doing well. She denies any problems with ambulating, voiding or po intake. Denies nausea or vomiting. She has passed flatus. Pain is well controlled.  Lochia is minimal.  Objective: Blood pressure (!) 114/56, pulse 60, temperature 98 F (36.7 C), temperature source Oral, resp. rate 17, height 5\' 2"  (1.575 m), weight 73.9 kg, SpO2 (!) 9 %, unknown if currently breastfeeding.  Physical Exam:  General: alert, cooperative and no distress Chest: no respiratory distress Heart:regular rate, distal pulses intact Abdomen: soft, nontender,  Uterine Fundus: firm, appropriately tender DVT Evaluation: No calf swelling or tenderness Extremities: no LE edema Skin: warm, dry; incision clean/dry/intact w/ honeycomb dressing in place  Recent Labs    06/12/22 0509 06/13/22 0522  HGB 13.0 10.0*  HCT 39.3 29.7*    Assessment/Plan: 06/15/22 Lane is a 36 y.o. G3P2003 s/p repeat LTCS at [redacted]w[redacted]d.  POD#1 - Doing welll; pain well controlled. H/H appropriate  Routine postpartum care  OOB, ambulated  Lovenox for VTE prophylaxis Anemia: asymptomatic  Start po ferrous sulfate BID Contraception: unsure Feeding: breast  Elevated BP: mild range on arrival, normal labs, has been normotensive to low since delivery. CTM, no meds at this time, already has appt scheduled for 1 week BP check.  Dispo: Plan for discharge POD#2.   LOS: 1 day   [redacted]w[redacted]d, MD/MPH Attending Family Medicine Physician, [facility], [facility]  06/13/2022, 4:44 PM

## 2022-06-13 NOTE — Lactation Note (Signed)
This note was copied from a baby's chart. Lactation Consultation Note  Patient Name: Cindy Lane Today's Date: 06/13/2022   Age:36 hours   Consult Status Consult Status: Complete (mother declined follow up) see note from Candee Furbish, RN.    Lurline Hare [facility] 06/13/2022, 8:41 AM

## 2022-06-14 ENCOUNTER — Encounter (HOSPITAL_COMMUNITY): Payer: Self-pay | Admitting: Obstetrics & Gynecology

## 2022-06-14 MED ORDER — FERROUS SULFATE 325 (65 FE) MG PO TABS: 325.0000 mg | ORAL_TABLET | ORAL | 3 refills | Status: AC

## 2022-06-14 MED ORDER — IBUPROFEN 600 MG PO TABS: 600.0000 mg | ORAL_TABLET | Freq: Four times a day (QID) | ORAL | 0 refills | Status: AC

## 2022-06-14 MED ORDER — ACETAMINOPHEN 500 MG PO TABS: 1000.0000 mg | ORAL_TABLET | Freq: Four times a day (QID) | ORAL | 0 refills | Status: AC

## 2022-06-20 ENCOUNTER — Ambulatory Visit: Payer: Self-pay

## 2022-06-21 ENCOUNTER — Telehealth (HOSPITAL_COMMUNITY): Payer: Self-pay

## 2022-06-21 NOTE — Telephone Encounter (Signed)
Unable to reach patient. No voicemail option available.  Cindy Lane [facility]  06/21/22,1829
